# Patient Record
Sex: Female | Born: 1955 | Race: White | Hispanic: No | Marital: Married | Smoking: Never smoker
Health system: Southern US, Community
[De-identification: ages and names within clinical notes are randomized; demographics above are authoritative.]

## PROBLEM LIST (undated history)

## (undated) DIAGNOSIS — G4730 Sleep apnea, unspecified: Secondary | ICD-10-CM

## (undated) DIAGNOSIS — E785 Hyperlipidemia, unspecified: Secondary | ICD-10-CM

## (undated) DIAGNOSIS — S82892A Other fracture of left lower leg, initial encounter for closed fracture: Secondary | ICD-10-CM

## (undated) DIAGNOSIS — Z5189 Encounter for other specified aftercare: Secondary | ICD-10-CM

## (undated) DIAGNOSIS — I10 Essential (primary) hypertension: Secondary | ICD-10-CM

## (undated) DIAGNOSIS — B009 Herpesviral infection, unspecified: Secondary | ICD-10-CM

## (undated) DIAGNOSIS — R7303 Prediabetes: Secondary | ICD-10-CM

## (undated) DIAGNOSIS — Z86010 Personal history of colonic polyps: Secondary | ICD-10-CM

## (undated) DIAGNOSIS — M8580 Other specified disorders of bone density and structure, unspecified site: Secondary | ICD-10-CM

## (undated) DIAGNOSIS — M810 Age-related osteoporosis without current pathological fracture: Secondary | ICD-10-CM

## (undated) DIAGNOSIS — I240 Acute coronary thrombosis not resulting in myocardial infarction: Secondary | ICD-10-CM

## (undated) DIAGNOSIS — G2581 Restless legs syndrome: Secondary | ICD-10-CM

## (undated) HISTORY — DX: Essential (primary) hypertension: I10

## (undated) HISTORY — DX: Hyperlipidemia, unspecified: E78.5

## (undated) HISTORY — DX: Sleep apnea, unspecified: G47.30

## (undated) HISTORY — DX: Encounter for other specified aftercare: Z51.89

## (undated) HISTORY — PX: OTHER SURGICAL HISTORY: SHX169

## (undated) HISTORY — DX: Acute coronary thrombosis not resulting in myocardial infarction: I24.0

## (undated) HISTORY — DX: Restless legs syndrome: G25.81

## (undated) HISTORY — DX: Age-related osteoporosis without current pathological fracture: M81.0

## (undated) HISTORY — DX: Prediabetes: R73.03

## (undated) HISTORY — DX: Personal history of colonic polyps: Z86.010

## (undated) HISTORY — DX: Herpesviral infection, unspecified: B00.9

## (undated) HISTORY — DX: Other specified disorders of bone density and structure, unspecified site: M85.80

## (undated) HISTORY — DX: Other fracture of left lower leg, initial encounter for closed fracture: S82.892A

---
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
## 2000-06-10 ENCOUNTER — Encounter: Payer: Self-pay | Admitting: Family Medicine

## 2000-06-10 ENCOUNTER — Encounter: Admission: RE | Admit: 2000-06-10 | Discharge: 2000-06-10 | Payer: Self-pay | Admitting: Family Medicine

## 2003-06-26 HISTORY — PX: OTHER SURGICAL HISTORY: SHX169

## 2003-07-22 ENCOUNTER — Encounter: Admission: RE | Admit: 2003-07-22 | Discharge: 2003-07-22 | Payer: Self-pay | Admitting: Family Medicine

## 2003-07-29 ENCOUNTER — Encounter: Admission: RE | Admit: 2003-07-29 | Discharge: 2003-07-29 | Payer: Self-pay | Admitting: Family Medicine

## 2003-11-08 ENCOUNTER — Ambulatory Visit (HOSPITAL_COMMUNITY): Admission: RE | Admit: 2003-11-08 | Discharge: 2003-11-09 | Payer: Self-pay | Admitting: Neurological Surgery

## 2007-07-18 ENCOUNTER — Other Ambulatory Visit: Admission: RE | Admit: 2007-07-18 | Discharge: 2007-07-18 | Payer: Self-pay | Admitting: Obstetrics & Gynecology

## 2007-12-04 ENCOUNTER — Other Ambulatory Visit: Admission: RE | Admit: 2007-12-04 | Discharge: 2007-12-04 | Payer: Self-pay | Admitting: Obstetrics & Gynecology

## 2008-01-24 DIAGNOSIS — Z860100 Personal history of colon polyps, unspecified: Secondary | ICD-10-CM

## 2008-01-24 HISTORY — DX: Personal history of colon polyps, unspecified: Z86.0100

## 2008-01-24 HISTORY — DX: Personal history of colonic polyps: Z86.010

## 2008-07-29 ENCOUNTER — Other Ambulatory Visit: Admission: RE | Admit: 2008-07-29 | Discharge: 2008-07-29 | Payer: Self-pay | Admitting: Obstetrics & Gynecology

## 2008-10-11 ENCOUNTER — Encounter (INDEPENDENT_AMBULATORY_CARE_PROVIDER_SITE_OTHER): Payer: Self-pay | Admitting: Family Medicine

## 2008-10-11 ENCOUNTER — Ambulatory Visit: Payer: Self-pay

## 2008-12-30 ENCOUNTER — Encounter: Admission: RE | Admit: 2008-12-30 | Discharge: 2008-12-30 | Payer: Self-pay | Admitting: Otolaryngology

## 2009-09-23 HISTORY — PX: BREAST SURGERY: SHX581

## 2009-10-12 LAB — HM DEXA SCAN

## 2010-06-01 ENCOUNTER — Encounter
Admission: RE | Admit: 2010-06-01 | Discharge: 2010-06-01 | Payer: Self-pay | Source: Home / Self Care | Admitting: Family Medicine

## 2010-07-21 ENCOUNTER — Encounter
Admission: RE | Admit: 2010-07-21 | Discharge: 2010-07-21 | Payer: Self-pay | Source: Home / Self Care | Attending: Family Medicine | Admitting: Family Medicine

## 2010-11-01 ENCOUNTER — Encounter: Payer: Self-pay | Admitting: Cardiovascular Disease

## 2010-11-08 ENCOUNTER — Encounter: Payer: Self-pay | Admitting: Cardiovascular Disease

## 2010-11-10 NOTE — Op Note (Signed)
NAME:  Tracy Cabrera, Tracy Cabrera                         ACCOUNT NO.:  [id number]   MEDICAL RECORD NO.:  [id number]                   PATIENT TYPE:  OIB   LOCATION:  2899                                 FACILITY:  [facility]   PHYSICIAN:  Stefani Dama, M.D.               DATE OF BIRTH:  Apr 11, 1956   DATE OF PROCEDURE:  11/08/2003  DATE OF DISCHARGE:                                 OPERATIVE REPORT   PREOPERATIVE DIAGNOSES:  Herniated nucleus pulposus L5-S1, left, with left  lumbar radiculopathy.   POSTOPERATIVE DIAGNOSES:  Herniated nucleus pulposus L5-S1, left, with left  lumbar radiculopathy.   PROCEDURE:  L5-S1 left MET-RX micro-endoscopic diskectomy with the operating  microscope and micro-dissection technique.   SURGEON:  Stefani Dama, M.D.   ASSISTANT:  Hilda Lias, M.D.   ANESTHESIA:  General endotracheal.   INDICATIONS FOR PROCEDURE:  The patient is a 55 year old individual who has  had significant back and left lower extremity pain.  She has had evidence of  a herniated nucleus pulposus at L4-L5 on the left side.  She has been  advised regarding a diskectomy.   DESCRIPTION OF PROCEDURE:  The patient was brought to the operating room  supine on the stretcher.  After a smooth induction of general endotracheal  anesthesia, she was turned prone.  The back was shaved and prepped with  Hibiclens and alcohol, and then draped in a sterile fashion.  Using  fluoroscopic guidance, then the L5-S1 space was localized on the left side,  and making a small incision in the lateral aspect on the left side at L5-S1,  a K-wire was placed down to the laminar arch of L5, and using a wanding  technique a series of dilators were passed over the K-wire to dilate this to  the 18 mm diameter.  An 18 mm diameter by 7 cm deep endoscopic cannula was  then placed into the patient's back and affixed to the operating table with  a clamp.  The microscope was draped and brought into the field, and then  using monopolar cautery, the soft tissues over the laminar arch interspace  at L5-S1 was cleared.  A laminotomy, removing the inferior margin of the  lamina of L5 and the mesial wall of the facet was then performed using a  high-speed air drill and 2.3 mm dissecting tool.  The dura itself was  identified ultimately after opening the yellow ligament underneath the  laminar arch, and bony bleeding from the laminar arch was controlled with  some small pledgets of Gelfoam soaked in thrombin, which were later  irrigated away.  The dissection was then carried out to expose the common  dural tube.  Just cephalad to the disk space was noted to be a thick  __________ of scar, scarring the dural tube to the posterior longitudinal  ligament structures.  Dissection along the spleen was very difficult,  requiring substantial traction on  the common dural tube.  This was done,  however, in a very careful fashion, so as not to create any spinal fluid  leaks.  Ultimately the undersurface of the dura was partially dissected, and  the thickened ligament in this region was incised.  There was noted to be  some fragments of disk which were densely adhered to the posterior  longitudinal ligament, and the ligament itself was noted to be calcified, in  combination with being attached to the dura very densely.  Care was taken  during this dissection to extend the dissection cephalad to what was noted  to be the takeoff of the L5 nerve root.  The thick scar tissue was carefully  divided in a sharp fashion using bipolar cautery, and then some micro-  scissors along with a #11 blade on a bayonet scalpel holder, to allow for  dissection of this tissue and freeing it from the dura without obtaining any  cerebral spinal fluid leaks.  In the end, the fragment was mobilized and it  was then removed along with the ligament in a piecemeal fashion.  The  cephalad-most portion was pulled down and then ultimately cut with a  #11  blade.  This allowed for a substantial venous bleeding which was controlled  with some tamponade with Gelfoam.  The Gelfoam was again irrigated away.  The field was cleared of any mass effect from the Gelfoam itself.  The  ligament itself was then resected, along with the fragments of disk embedded  within it, all the way down to the area of the disk space.  The disk space  was entered and several small fragments of degenerated disk material were  removed from within it.  The opening of the disk space was noted to be quite  small, as the disk space had settled considerably.  Decompression of this  area with a resection of the ligamentous tissues around the area of the disk  space was performed, and hemostasis was ultimately obtained in this region.  The area was copiously irrigated with antibiotic irrigating solution, and  then 1 mL of fentanyl was left in place, with 40 mg of Depo-Medrol in the  epidural space.  The retractor was then removed.  The subcuticular tissue  was closed with #3-0 Vicryl in an interrupted fashion, and Dermabond was  used on the skin.  The patient tolerated the procedure well.  The blood loss was estimated at  approximately 150 mL.  The patient was returned to the recovery room in  stable condition.                                               Stefani Dama, M.D.    Merla Riches  D:  11/08/2003  T:  11/08/2003  Job:  [id number]

## 2010-11-22 LAB — HM COLONOSCOPY

## 2011-06-05 ENCOUNTER — Ambulatory Visit
Admission: RE | Admit: 2011-06-05 | Discharge: 2011-06-05 | Disposition: A | Discharge status: Home / Self Care | Payer: 59 | Source: Ambulatory Visit | Attending: Family Medicine | Admitting: Family Medicine

## 2011-06-05 ENCOUNTER — Other Ambulatory Visit: Payer: Self-pay | Admitting: Family Medicine

## 2011-07-12 ENCOUNTER — Institutional Professional Consult (permissible substitution): Payer: 59 | Admitting: Cardiovascular Disease

## 2011-07-19 ENCOUNTER — Encounter: Payer: Self-pay | Admitting: Cardiovascular Disease

## 2011-07-19 ENCOUNTER — Ambulatory Visit (INDEPENDENT_AMBULATORY_CARE_PROVIDER_SITE_OTHER): Payer: 59 | Admitting: Cardiovascular Disease

## 2011-07-19 DIAGNOSIS — R079 Chest pain, unspecified: Secondary | ICD-10-CM

## 2011-07-19 DIAGNOSIS — I209 Angina pectoris, unspecified: Secondary | ICD-10-CM | POA: Insufficient documentation

## 2011-07-19 DIAGNOSIS — R0602 Shortness of breath: Secondary | ICD-10-CM

## 2011-07-19 NOTE — Patient Instructions (Signed)
Your physician has requested that you have an exercise stress myoview. For further information please visit [email or web address] Please follow instruction sheet, as given.  Your physician has requested that you have an echocardiogram. Echocardiography is a painless test that uses sound waves to create images of your heart. It provides your doctor with information about the size and shape of your heart and how well your heart's chambers and valves are working. This procedure takes approximately one hour. There are no restrictions for this procedure.  Your physician wants you to follow-up in: 2 YEARS You will receive a reminder letter in the mail two months in advance. If you don't receive a letter, please call our office to schedule the follow-up appointment.

## 2011-07-19 NOTE — Assessment & Plan Note (Signed)
The patient has chest pain with both typical and atypical features. She has a strong risk profile predisposing her to coronary atherosclerosis with the presence of hypertension, dyslipidemia, and family history of coronary artery disease. I have recommended an exercise stress Myoview scan to rule out inducible ischemia. I reviewed the patient's lipid panel and her modifiable risk factors are under excellent control on her current medical therapy. Will determine followup based on the results of her stress test. If this is normal I would like to see her at least every other year in followup to maintain a relationship in this patient with significant ongoing risk of coronary disease.

## 2011-07-19 NOTE — Assessment & Plan Note (Signed)
I suspect this is related to asthma. However, considering the patient's family history of cardiomyopathy and minor abnormalities seen on her previous echo with calcification of the mitral annulus and dilatation of the left atrium, I recommended a repeat echocardiogram to rule out diastolic abnormalities or the development of LV dysfunction.

## 2011-07-19 NOTE — Progress Notes (Signed)
HPI:  This is a 56 year old woman presenting for initial evaluation of chest pain. She is followed by Dr. Duaine Dredge. The patient has developed occasional episodes of chest pain that occur with walking. She has difficulty characterizing the pain, stating that "it is not a pressure, but feels like someone is pushing on my chest." This occurs with walking at a brisk pace and improves with slowing down or stopping. She walks regularly at the [facility], up to 2-1/2 miles daily. The patient's chest pain does not occur every time she walks and it has been unpredictable. She also complains of a numbness in the left arm that occurs at rest and with exertion. She has chronic shortness of breath with exertion and relates this to asthma. This is unchanged over time and is relieved with inhalers. She denies edema, orthopnea, PND, lightheadedness, or syncope.  The patient has had a stress test but it has been approximately 15 years ago. She tells me this was normal. The patient had an echocardiogram done in our office on October 11, 2008. This showed normal left ventricular size and systolic function with an ejection fraction estimated at 55-65%. There was trivial aortic regurgitation and mild calcification of the mitral valve annulus. The left atrium was mildly dilated.  In review of the patient's medical records, her hemoglobin A1c was 6.1, myeloperoxidase was in the normal range of 343, LDL was 72, LP(a) is 3.0 (normal), total cholesterol is 135, triglycerides 104, HDL 43, April lipoprotein B1 100 is 66.  The patient has a strong family history of cardiac disease. She has a sister who died at age 79 of complications related to cardiomyopathy. Her father had 4 vessel CABG at age 76 and died of a myocardial infarction at age 92.  Outpatient Encounter Prescriptions as of 07/19/2011  Medication Sig Dispense Refill  . albuterol (PROVENTIL HFA;VENTOLIN HFA) 108 (90 BASE) MCG/ACT inhaler Inhale 2 puffs into the lungs every 6 (six)  hours as needed.      . calcium-vitamin D (OSCAL WITH D) 500-200 MG-UNIT per tablet Take 1 tablet by mouth daily.      . chlorthalidone (HYGROTON) 25 MG tablet Take 25 mg by mouth daily.       . clobetasol cream (TEMOVATE) 0.05 % Apply 1 application topically 2 (two) times daily.      . diazepam (VALIUM) 5 MG tablet Take 5 mg by mouth every 6 (six) hours as needed.       . fexofenadine (ALLEGRA) 180 MG tablet Take 180 mg by mouth as needed.      . fish oil-omega-3 fatty acids 1000 MG capsule Take 1,400 mg by mouth daily.      Marland Kitchen ibuprofen (ADVIL,MOTRIN) 100 MG tablet Take 100 mg by mouth every 6 (six) hours as needed.      Marland Kitchen lisinopril (PRINIVIL,ZESTRIL) 10 MG tablet Take 10 mg by mouth daily.      . naproxen sodium (ANAPROX) 220 MG tablet Take 220 mg by mouth as needed.      Marland Kitchen NITROSTAT 0.4 MG SL tablet Place 0.4 mg under the tongue every 5 (five) minutes as needed.       . NON FORMULARY 400 mg daily as needed.      Marland Kitchen QVAR 80 MCG/ACT inhaler Inhale 1 puff into the lungs 2 (two) times daily.       . Red Yeast Rice 600 MG CAPS Take 1,200 mg by mouth.      . simvastatin (ZOCOR) 40 MG tablet Take 40  mg by mouth every evening.        Vicodin  Past Medical History  Diagnosis Date  . Hyperlipidemia   . Asthma   . Hypertension   . Prediabetes     Past Surgical History  Procedure Date  . Left eye surgery for a retinal tear   . L5/s1 discectomy     History   Social History  . Marital Status: Married    Spouse Name: N/A    Number of Children: N/A  . Years of Education: N/A   Occupational History  . Not on file.   Social History Main Topics  . Smoking status: Never Smoker   . Smokeless tobacco: Not on file   Comment: lives with a heavy smoker  . Alcohol Use: Not on file  . Drug Use: Not on file  . Sexually Active: Not on file   Other Topics Concern  . Not on file   Social History Narrative   The patient is married. 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  She does not  smoke cigarettes. She drinks alcohol on rare occasion.   ROS: General: no fevers/chills/night sweats Eyes: no blurry vision, diplopia, or amaurosis ENT: no sore throat or hearing loss Resp: no cough, wheezing, or hemoptysis CV: no edema or palpitations GI: no abdominal pain, nausea, vomiting, diarrhea, or constipation GU: no dysuria, frequency, or hematuria Skin: no rash Neuro: positive for rare headache, otherwise negative for numbness, tingling, or weakness of extremities Musculoskeletal: positive for back pain, no joint pain or swelling Heme: no bleeding, DVT, positive for easy bruising Endo: no polydipsia or polyuria  BP 112/76  Pulse 67  Ht 5\' 5"  (1.651 m)  Wt 103.783 kg (228 lb 12.8 oz)  BMI 38.07 kg/m2  PHYSICAL EXAM: Pt is alert and oriented, very pleasant overweight woman, in no distress. HEENT: normal Neck: JVP normal. Carotid upstrokes normal without bruits. No thyromegaly. Lungs: equal expansion, clear bilaterally CV: Apex is discrete and nondisplaced, RRR without murmur or gallop Abd: soft, NT, +BS, no bruit, no hepatosplenomegaly Back: no CVA tenderness Ext: no C/C/E        Femoral pulses 2+= without bruits        DP/PT pulses intact and = Skin: warm and dry without rash Neuro: CNII-XII intact             Strength intact = bilaterally  EKG:  Normal sinus rhythm 67 beats per minute, within normal limits.  ASSESSMENT AND PLAN:

## 2011-07-24 ENCOUNTER — Ambulatory Visit (HOSPITAL_COMMUNITY): Payer: 59 | Attending: Cardiology | Admitting: Radiology

## 2011-07-24 DIAGNOSIS — R079 Chest pain, unspecified: Secondary | ICD-10-CM | POA: Insufficient documentation

## 2011-07-24 DIAGNOSIS — R7309 Other abnormal glucose: Secondary | ICD-10-CM | POA: Insufficient documentation

## 2011-07-24 DIAGNOSIS — R0602 Shortness of breath: Secondary | ICD-10-CM

## 2011-07-24 DIAGNOSIS — R072 Precordial pain: Secondary | ICD-10-CM

## 2011-07-24 DIAGNOSIS — J45909 Unspecified asthma, uncomplicated: Secondary | ICD-10-CM | POA: Insufficient documentation

## 2011-07-24 DIAGNOSIS — E785 Hyperlipidemia, unspecified: Secondary | ICD-10-CM | POA: Insufficient documentation

## 2011-07-31 ENCOUNTER — Ambulatory Visit (HOSPITAL_COMMUNITY): Payer: 59 | Attending: Cardiology | Admitting: Radiology

## 2011-07-31 DIAGNOSIS — R0602 Shortness of breath: Secondary | ICD-10-CM

## 2011-07-31 DIAGNOSIS — I10 Essential (primary) hypertension: Secondary | ICD-10-CM | POA: Insufficient documentation

## 2011-07-31 DIAGNOSIS — R079 Chest pain, unspecified: Secondary | ICD-10-CM

## 2011-07-31 DIAGNOSIS — E785 Hyperlipidemia, unspecified: Secondary | ICD-10-CM | POA: Insufficient documentation

## 2011-07-31 DIAGNOSIS — R209 Unspecified disturbances of skin sensation: Secondary | ICD-10-CM | POA: Insufficient documentation

## 2011-07-31 DIAGNOSIS — Z8249 Family history of ischemic heart disease and other diseases of the circulatory system: Secondary | ICD-10-CM | POA: Insufficient documentation

## 2011-07-31 MED ORDER — TECHNETIUM TC 99M TETROFOSMIN IV KIT
30.0000 | PACK | Freq: Once | INTRAVENOUS | Status: AC | PRN
  Administered 2011-07-31: 30 via INTRAVENOUS

## 2011-07-31 MED ORDER — TECHNETIUM TC 99M TETROFOSMIN IV KIT
10.0000 | PACK | Freq: Once | INTRAVENOUS | Status: AC | PRN
  Administered 2011-07-31: 10 via INTRAVENOUS

## 2011-07-31 NOTE — Progress Notes (Signed)
[name] SITE 3 NUCLEAR MED [address] [place] [address] [phone]  Cardiology Nuclear Med Study  Tracy Cabrera is a 56 y.o. female [id number] January 12, 1956   Nuclear Med Background Indication for Stress Test:  Evaluation for Ischemia History: '10 Echo:EF=55-65%,normal trivial AR and approx 15 yrs ago ION:GEXBMW per patient and approx 16 yrs ago MPS: normal per patient Cardiac Risk Factors: Family History - CAD, Hypertension and Lipids  Symptoms:  Chest Pain with and without exertion,numbness in Left arm and UXL:[id number]   Nuclear Pre-Procedure Caffeine/Decaff Intake:  9:00pm NPO After: 7:00pm   Lungs:  clear IV 0.9% NS with Angio Cath:  20g  IV Site: R Hand  IV Started by:  [name], RN  Chest Size (in):  42 Cup Size: D  Height: 5\' 5"  (1.651 m)  Weight:  226 lb (102.513 kg)  BMI:  Body mass index is 37.61 kg/(m^2). Tech Comments:  n/a    Nuclear Med Study 1 or 2 day study: 1 day  Stress Test Type:  Stress  Reading MD: [name], MD  Order Authorizing Provider:  Casimiro Needle Cooper,MD  Resting Radionuclide: Technetium 80m Tetrofosmin  Resting Radionuclide Dose: 11.0 mCi   Stress Radionuclide:  Technetium 60m Tetrofosmin  Stress Radionuclide Dose: 33.0 mCi           Stress Protocol Rest HR: 63 Stress HR: 157  Rest BP: 118/82 Stress BP: 155/71  Exercise Time (min): 9:00 METS: 10.1   Predicted Max HR: 163 bpm % Max HR: 96.32 bpm Rate Pressure Product: 72536   Dose of Adenosine (mg):  n/a Dose of Lexiscan: 0.4 mg  Dose of Atropine (mg): n/a Dose of Dobutamine: n/a mcg/kg/min (at max HR)  Stress Test Technologist: [name], RN  Nuclear Technologist:  Domenic Polite, CNMT     Rest Procedure:  Myocardial perfusion imaging was performed at rest 45 minutes following the intravenous administration of Technetium 54m Tetrofosmin. Rest ECG: NSR  Stress Procedure:  The patient exercised for 9:00.  The patient stopped due to  fatigue and target HR achieved. Patient had some SOB and denied any chest pain.  There were nonspecific  ST-T wave changes and no ectopy. Patient had expiratory wheezing in recovery. Patient used her Albuterol inhaler. Technetium 58m Tetrofosmin was injected at peak exercise and myocardial perfusion imaging was performed after a brief delay. Stress ECG: No significant change from baseline ECG  QPS Raw Data Images:  Patient motion noted; appropriate software correction applied. Stress Images:  Normal homogeneous uptake in all areas of the myocardium. Rest Images:  Normal homogeneous uptake in all areas of the myocardium. Subtraction (SDS):  No evidence of ischemia. Transient Ischemic Dilatation (Normal <1.22):  0.84 Lung/Heart Ratio (Normal <0.45):  0.28  Quantitative Gated Spect Images QGS EDV:  81 ml QGS ESV:  20 ml QGS cine images:  Normal Wall Motion QGS EF: 76%  Impression Exercise Capacity:  Good exercise capacity. BP Response:  Normal blood pressure response. Clinical Symptoms:  No chest pain. ECG Impression:  No significant ST segment change suggestive of ischemia. Comparison with Prior Nuclear Study: No images to compare  Overall Impression:  Normal stress nuclear study.  [name], MD

## 2011-08-02 ENCOUNTER — Telehealth: Payer: Self-pay | Admitting: Cardiovascular Disease

## 2011-08-02 NOTE — Telephone Encounter (Signed)
Fu call Patient returning your call from yesterday pls call her at work

## 2011-08-02 NOTE — Telephone Encounter (Signed)
Left message for pt to call back  °

## 2011-08-02 NOTE — Telephone Encounter (Signed)
Fu call °Patient returning your call again °

## 2011-08-02 NOTE — Telephone Encounter (Signed)
Pt aware of myoview and echo results by phone.  I have scheduled the pt to see Dr Excell Seltzer on 08/21/11.

## 2011-08-21 ENCOUNTER — Encounter: Payer: Self-pay | Admitting: Cardiovascular Disease

## 2011-08-21 ENCOUNTER — Ambulatory Visit (INDEPENDENT_AMBULATORY_CARE_PROVIDER_SITE_OTHER): Payer: 59 | Admitting: Cardiovascular Disease

## 2011-08-21 DIAGNOSIS — R079 Chest pain, unspecified: Secondary | ICD-10-CM

## 2011-08-21 DIAGNOSIS — I519 Heart disease, unspecified: Secondary | ICD-10-CM

## 2011-08-21 DIAGNOSIS — I5189 Other ill-defined heart diseases: Secondary | ICD-10-CM | POA: Insufficient documentation

## 2011-08-21 NOTE — Patient Instructions (Signed)
Your physician recommends that you continue on your current medications as directed. Please refer to the Current Medication list given to you today.   Your physician wants you to follow-up in:1 YEAR  You will receive a reminder letter in the mail two months in advance. If you don't receive a letter, please call our office to schedule the follow-up appointment.  

## 2011-08-21 NOTE — Progress Notes (Signed)
   HPI:  This is a 56 year old woman presenting for followup evaluation. The patient was seen January 24 for evaluation of chest pain. She underwent a nuclear stress test and an echocardiogram for evaluation.  The patient's exercise Myoview stress test result was reviewed. She exercised for 9 minutes according to the Bruce protocol. Expiratory wheezing was noted during the recovery phase and she used her albuterol inhaler. There was normal perfusion throughout the myocardium.  The patient's gated left ventricular ejection fraction was 76%.  An echocardiogram was also performed. This was done because of the patient's family history of cardiomyopathy. This demonstrated normal left ventricular size and systolic function with an ejection fraction of 55-60%. Features were consistent with pseudo-normal left ventricular filling suggestive of abnormal LV relaxation and increased filling pressure (grade 2 diastolic dysfunction).  The patient reports no change in her symptoms. She walks 2 miles at a 4 mile per hour pace. Sometimes she has chest pain and shortness of breath with exertion but other times she is able to walk without symptoms. She denies leg swelling, orthopnea, or PND.  Outpatient Encounter Prescriptions as of 08/21/2011  Medication Sig Dispense Refill  . albuterol (PROVENTIL HFA;VENTOLIN HFA) 108 (90 BASE) MCG/ACT inhaler Inhale 2 puffs into the lungs every 6 (six) hours as needed.      . calcium-vitamin D (OSCAL WITH D) 500-200 MG-UNIT per tablet Take 1 tablet by mouth daily.      . chlorthalidone (HYGROTON) 25 MG tablet Take 25 mg by mouth daily.       . clobetasol cream (TEMOVATE) 0.05 % Apply 1 application topically as needed.       . diazepam (VALIUM) 5 MG tablet Take 5 mg by mouth every 6 (six) hours as needed.       . fexofenadine (ALLEGRA) 180 MG tablet Take 180 mg by mouth as needed.      . fish oil-omega-3 fatty acids 1000 MG capsule Take 1,400 mg by mouth daily.      Marland Kitchen ibuprofen  (ADVIL,MOTRIN) 100 MG tablet Take 100 mg by mouth every 6 (six) hours as needed.      Marland Kitchen lisinopril (PRINIVIL,ZESTRIL) 10 MG tablet Take 10 mg by mouth daily.      . naproxen sodium (ANAPROX) 220 MG tablet Take 220 mg by mouth as needed.      Marland Kitchen NITROSTAT 0.4 MG SL tablet Place 0.4 mg under the tongue every 5 (five) minutes as needed.       . NON FORMULARY 400 mg daily as needed.      Marland Kitchen QVAR 80 MCG/ACT inhaler Inhale 1 puff into the lungs 2 (two) times daily.       . Red Yeast Rice 600 MG CAPS Take 1,200 mg by mouth.      . simvastatin (ZOCOR) 40 MG tablet Take 40 mg by mouth every evening.        Allergies  Allergen Reactions  . Vicodin (Hydrocodone-Acetaminophen)     Nausea     Past Medical History  Diagnosis Date  . Hyperlipidemia   . Asthma   . Hypertension   . Prediabetes     BP 134/86  Pulse 72  Ht 5\' 5"  (1.651 m)  Wt 103.329 kg (227 lb 12.8 oz)  BMI 37.91 kg/m2  PHYSICAL EXAM: Pt is alert and oriented, very pleasant woman in no acute distress. The physical examination was deferred today as our time was spent in discussion (total of 25 minutes).  ASSESSMENT AND PLAN:

## 2011-08-21 NOTE — Assessment & Plan Note (Signed)
Stress test results were reviewed with the patient. There is no evidence of myocardial ischemia. Elevated LV filling pressures could be causing angina, but I don't see any room to increase her medicines as she has excellent control of blood pressure on combination of ACE inhibitor and diuretic.

## 2011-08-21 NOTE — Assessment & Plan Note (Signed)
I had a long discussion with the patient and her husband today regarding the diagnosis of diastolic dysfunction. I would anticipate that this patient has a good prognosis. She has excellent control of her blood pressure. She is on appropriate medical therapy with a combination of chlorthalidone and lisinopril. She has a healthy blood pressure response with exertion as seen on her stress test. Her home blood pressures are in the range of 115/70. I don't think there is any room to improve on this and I would continue her current medications. I would like to see her back in one year for followup. She understands that weight loss would have a significant impact on her long-term symptoms and would help with exercise tolerance and reduction in shortness of breath.

## 2011-11-27 LAB — HM PAP SMEAR: HM Pap smear: NEGATIVE

## 2012-01-09 LAB — HM MAMMOGRAPHY

## 2012-12-31 ENCOUNTER — Ambulatory Visit: Payer: Self-pay | Admitting: Obstetrics and Gynecology

## 2012-12-31 ENCOUNTER — Telehealth: Payer: Self-pay | Admitting: Obstetrics and Gynecology

## 2012-12-31 DIAGNOSIS — Z0000 Encounter for general adult medical examination without abnormal findings: Secondary | ICD-10-CM

## 2012-12-31 NOTE — Telephone Encounter (Signed)
Patient was called about her appointment that was scheduled for today. She stated that she wasn't aware of this appointment that she a Dr. Hyacinth Meeker Patient . Do you want to charge a dnka fee? This was and ov she is not scheduled for her gyn until 02-24-2013

## 2012-12-31 NOTE — Telephone Encounter (Signed)
Do not charge for a [facility] appointment.

## 2013-02-24 ENCOUNTER — Encounter: Payer: Self-pay | Admitting: Obstetrics & Gynecology

## 2013-02-24 ENCOUNTER — Ambulatory Visit (INDEPENDENT_AMBULATORY_CARE_PROVIDER_SITE_OTHER): Payer: 59 | Admitting: Obstetrics & Gynecology

## 2013-02-24 VITALS — BP 116/78 | HR 64 | Resp 16 | Ht 65.0 in | Wt 232.6 lb

## 2013-02-24 DIAGNOSIS — Z01419 Encounter for gynecological examination (general) (routine) without abnormal findings: Secondary | ICD-10-CM

## 2013-02-24 NOTE — Patient Instructions (Addendum)

## 2013-02-24 NOTE — Progress Notes (Signed)
57 y.o. [id number] MarriedCaucasianF here for annual exam.  Has 2 children, ages 72 and 55 yo, that she and spouse are now guardians.  These are her grand-nephews.  Planning to adopt the children.  Oldest just went to kindergarten.    Doing well.  No vaginal bleeding.    Patient's last menstrual period was 06/25/2000.          Sexually active: yes  The current method of family planning is post menopausal status.    Exercising: yes  walking Smoker:  no  Health Maintenance: Pap:  11/27/11 WNL/negative HR HPV History of abnormal Pap:  yes MMG:  01/09/12 normal-scheduled for 02/25/13 Colonoscopy:  2012 repeat in 3 years, Dr. Matthias Hughs BMD:   10/12/09 mild osteopenia (-0.8/-1.3) TDaP:  2011 Screening Labs: PCP, Hb today: PCP, Urine today: PCP   reports that she has never smoked. She has never used smokeless tobacco. She reports that  drinks alcohol. She reports that she does not use illicit drugs.  Past Medical History  Diagnosis Date  . Hyperlipidemia   . Asthma   . Hypertension   . Prediabetes   . History of colon polyps 01/2008    Dr. Kennyth Arnold due 2015  . Blood transfusion without reported diagnosis 1973  . Restless leg syndrome     Past Surgical History  Procedure Laterality Date  . Left eye surgery for a retinal tear    . L5/s1 discectomy    . Breast surgery  09/2009    benign bx--fibrocystic changes    Current Outpatient Prescriptions  Medication Sig Dispense Refill  . albuterol (PROVENTIL HFA;VENTOLIN HFA) 108 (90 BASE) MCG/ACT inhaler Inhale 2 puffs into the lungs every 6 (six) hours as needed.      Marland Kitchen aspirin 81 MG tablet Take 81 mg by mouth daily.      . calcium-vitamin D (OSCAL WITH D) 500-200 MG-UNIT per tablet Take 1 tablet by mouth daily.      . chlorthalidone (HYGROTON) 25 MG tablet Take 25 mg by mouth daily.       . Cholecalciferol (VITAMIN D PO) Take by mouth. 1000 IU 2-3 daily      . clobetasol cream (TEMOVATE) 0.05 % Apply 1 application topically as needed.       .  diazepam (VALIUM) 5 MG tablet Take 5 mg by mouth every 6 (six) hours as needed.       . famciclovir (FAMVIR) 125 MG tablet Take 400 mg by mouth. 5 days for fever blister as needed      . fexofenadine (ALLEGRA) 180 MG tablet Take 180 mg by mouth as needed.      . fish oil-omega-3 fatty acids 1000 MG capsule Take 1,400 mg by mouth daily.      . fluticasone (FLONASE) 50 MCG/ACT nasal spray       . losartan (COZAAR) 50 MG tablet Take 50 mg by mouth daily.      . naproxen sodium (ANAPROX) 220 MG tablet Take 220 mg by mouth as needed.      Marland Kitchen omeprazole (PRILOSEC) 20 MG capsule Take 20 mg by mouth 2 (two) times daily.      Marland Kitchen QVAR 80 MCG/ACT inhaler Inhale 2 puffs into the lungs 2 (two) times daily.       . simvastatin (ZOCOR) 40 MG tablet Take 40 mg by mouth every evening.      Marland Kitchen ibuprofen (ADVIL,MOTRIN) 100 MG tablet Take 100 mg by mouth every 6 (six) hours as needed.      Marland Kitchen  NITROSTAT 0.4 MG SL tablet Place 0.4 mg under the tongue every 5 (five) minutes as needed.        No current facility-administered medications for this visit.    Family History  Problem Relation Age of Onset  . Coronary artery disease Father 73    CABG  . Hypertension Father   . Cardiomyopathy Sister 17    died of complications of cardiomyopathy  . Hypertension Sister   . Colon cancer Paternal Grandmother   . Diabetes Brother   . Hypertension Brother   . Hypertension Mother     ROS:  Pertinent items are noted in HPI.  Otherwise, a comprehensive ROS was negative.  Exam:   BP 116/78  Pulse 64  Resp 16  Ht 5\' 5"  (1.651 m)  Wt 232 lb 9.6 oz (105.507 kg)  BMI 38.71 kg/m2  LMP 06/25/2000  Weight change: +6lbs   Height: 5\' 5"  (165.1 cm)  Ht Readings from Last 3 Encounters:  02/24/13 5\' 5"  (1.651 m)  08/21/11 5\' 5"  (1.651 m)  07/31/11 5\' 5"  (1.651 m)    General appearance: alert, cooperative and appears stated age Head: Normocephalic, without obvious abnormality, atraumatic Neck: no adenopathy, supple,  symmetrical, trachea midline and thyroid normal to inspection and palpation Lungs: clear to auscultation bilaterally Breasts: normal appearance, no masses or tenderness Heart: regular rate and rhythm Abdomen: soft, non-tender; bowel sounds normal; no masses,  no organomegaly Extremities: extremities normal, atraumatic, no cyanosis or edema Skin: Skin color, texture, turgor normal. No rashes or lesions Lymph nodes: Cervical, supraclavicular, and axillary nodes normal. No abnormal inguinal nodes palpated Neurologic: Grossly normal   Pelvic: External genitalia:  no lesions              Urethra:  normal appearing urethra with no masses, tenderness or lesions              Bartholins and Skenes: normal                 Vagina: normal appearing vagina with normal color and discharge, no lesions              Cervix: no lesions              Pap taken: no Bimanual Exam:  Uterus:  normal size, contour, position, consistency, mobility, non-tender              Adnexa: normal adnexa and no mass, fullness, tenderness               Rectovaginal: Confirms               Anus:  normal sphincter tone, no lesions  A:  Well Woman with normal exam PMP, no HRT H/O colonic polyps H/O LS&A  P:   Mammogram yearly.  Appt scheduled. pap smear with neg HR HPV 6/13 Labs with PCP return annually or prn  An After Visit Summary was printed and given to the patient.

## 2013-06-06 ENCOUNTER — Other Ambulatory Visit: Payer: Self-pay | Admitting: Obstetrics & Gynecology

## 2013-06-08 NOTE — Telephone Encounter (Signed)
AEX was 02/24/13  Last Refilled: AEX 11/27/11 #60 grams with 2 refills was given No rx was given at AEX  Please advise.

## 2014-02-16 ENCOUNTER — Encounter: Payer: Self-pay | Admitting: Obstetrics & Gynecology

## 2014-03-12 ENCOUNTER — Ambulatory Visit: Payer: 59 | Admitting: Obstetrics & Gynecology

## 2014-04-26 ENCOUNTER — Encounter: Payer: Self-pay | Admitting: Obstetrics & Gynecology

## 2014-05-10 ENCOUNTER — Ambulatory Visit (INDEPENDENT_AMBULATORY_CARE_PROVIDER_SITE_OTHER): Payer: 59 | Admitting: Cardiovascular Disease

## 2014-05-10 ENCOUNTER — Encounter: Payer: Self-pay | Admitting: Cardiovascular Disease

## 2014-05-10 VITALS — BP 134/88 | HR 58 | Ht 65.0 in | Wt 248.8 lb

## 2014-05-10 DIAGNOSIS — R0789 Other chest pain: Secondary | ICD-10-CM

## 2014-05-10 NOTE — Patient Instructions (Signed)
Your physician wants you to follow-up in: 2 YEARS with Dr Burt Knack.  You will receive a reminder letter in the mail two months in advance. If you don't receive a letter, please call our office to schedule the follow-up appointment.  Your physician recommends that you continue on your current medications as directed. Please refer to the Current Medication list given to you today.

## 2014-05-10 NOTE — Progress Notes (Signed)
Background: the patient has been followed for chest pain. She underwent nuclear stress testing in 2013 demonstrating normal myocardial perfusion and normal LV function with an ejection fraction of 76%. An echocardiogram was performed because of her family history of cardiomyopathy in this confirmed normal LV systolic function with an EF of 55-60%. There was grade 2 diastolic dysfunction.  HPI:  58 year old woman presenting for follow-up evaluation. She has occasional resting chest discomfort and she attributes this to stress. She has no exertional symptoms. Describes the pain as a "pressure." She's been under a great deal of stress related to a complicated home situation. She's not been engaged in regular physical exercise. She denies dyspnea, edema, or heart palpitations. She's had no orthopnea or PND.   Outpatient Encounter Prescriptions as of 05/10/2014  Medication Sig  . albuterol (PROVENTIL HFA;VENTOLIN HFA) 108 (90 BASE) MCG/ACT inhaler Inhale 2 puffs into the lungs every 6 (six) hours as needed.  . ALPRAZolam (XANAX) 0.5 MG tablet Take 0.5 mg by mouth as needed.  Marland Kitchen aspirin 81 MG tablet Take 81 mg by mouth daily.  . calcium-vitamin D (OSCAL WITH D) 500-200 MG-UNIT per tablet Take 1 tablet by mouth daily.  . chlorthalidone (HYGROTON) 25 MG tablet Take 25 mg by mouth daily.   . Cholecalciferol (VITAMIN D PO) Take by mouth. 1000 IU 2-3 daily  . clobetasol ointment (TEMOVATE) 0.05 % APPLY TO AFFECTED AREA TWICE A DAY FOR UP TO 7 DAYS AS NEEDED  . diazepam (VALIUM) 5 MG tablet Take 5 mg by mouth every 6 (six) hours as needed.   . famciclovir (FAMVIR) 125 MG tablet Take 400 mg by mouth. 5 days for fever blister as needed  . fexofenadine (ALLEGRA) 180 MG tablet Take 180 mg by mouth as needed.  . fish oil-omega-3 fatty acids 1000 MG capsule Take 1,400 mg by mouth daily.  . fluticasone (FLONASE) 50 MCG/ACT nasal spray   . ibuprofen (ADVIL,MOTRIN) 100 MG tablet Take 100 mg by mouth every 6 (six)  hours as needed.  Marland Kitchen losartan (COZAAR) 50 MG tablet Take 50 mg by mouth daily.  . naproxen sodium (ANAPROX) 220 MG tablet Take 220 mg by mouth as needed.  Marland Kitchen NITROSTAT 0.4 MG SL tablet Place 0.4 mg under the tongue every 5 (five) minutes as needed.   Marland Kitchen omeprazole (PRILOSEC) 20 MG capsule Take 20 mg by mouth 2 (two) times daily.  Marland Kitchen QVAR 80 MCG/ACT inhaler Inhale 2 puffs into the lungs 2 (two) times daily.   . simvastatin (ZOCOR) 40 MG tablet Take 40 mg by mouth every evening.    Allergies  Allergen Reactions  . Vicodin [Hydrocodone-Acetaminophen]     Nausea     Past Medical History  Diagnosis Date  . Hyperlipidemia   . Asthma   . Hypertension   . Prediabetes   . History of colon polyps 01/2008    Dr. Idolina Primer due 2015  . Blood transfusion without reported diagnosis 1973  . Restless leg syndrome     family history includes Cardiomyopathy (age of onset: 64) in her sister; Colon cancer in her paternal grandmother; Coronary artery disease (age of onset: 46) in her father; Diabetes in her brother; Hypertension in her brother, father, mother, and sister.   ROS: Negative except as per HPI  BP 134/88 mmHg  Pulse 58  Ht 5\' 5"  (1.651 m)  Wt 248 lb 12.8 oz (112.855 kg)  BMI 41.40 kg/m2  LMP 06/25/2000  PHYSICAL EXAM: Pt is alert and oriented, pleasant obese  woman inNAD HEENT: normal Neck: JVP - normal, carotids 2+= without bruits Lungs: CTA bilaterally CV: RRR without murmur or gallop Abd: soft, NT, Positive BS, no hepatomegaly Ext: no C/C/E, distal pulses intact and equal Skin: warm/dry no rash  EKG:  Sinus bradycardia 58 beats per minute, within normal limits.  ASSESSMENT AND PLAN: 1. Hyperlipidemia. The patient is treated with simvastatin and fish oil. Labs reviewed with cholesterol 171, LDL 94, HDL 47, and triglycerides 176. LP (a) and Apo B1 are normal. Managed by Dr Sandi Mariscal. We reviewed the importance of weight loss, dietary modification, and exercise today.  2.  Chest pain. Fairly clear this is related to stress. I've reviewed her previous cardiac studies with no evidence for ischemia. I do not think further testing is indicated at this time.   3. Diastolic dysfunction. No evidence of volume overload on exam or based on history. Reviewed the link between obesity and development of diastolic CHF. She will work on lifestyle changes.  For follow-up, I will plan on seeing her back in 2 years since she follows regularly with Dr Sandi Mariscal.  Sherren Mocha, MD 05/10/2014 5:34 PM

## 2014-05-11 ENCOUNTER — Telehealth: Payer: Self-pay | Admitting: Obstetrics & Gynecology

## 2014-05-11 NOTE — Telephone Encounter (Signed)
lmtcb to reschedule AEX with Dr. Sabra Heck that was cancelled due to a surgery conflict 24/09/73.

## 2014-05-19 ENCOUNTER — Ambulatory Visit: Payer: 59 | Admitting: Obstetrics & Gynecology

## 2014-06-07 ENCOUNTER — Encounter: Payer: Self-pay | Admitting: Cardiovascular Disease

## 2014-06-14 ENCOUNTER — Ambulatory Visit: Payer: 59 | Admitting: Obstetrics & Gynecology

## 2014-06-22 ENCOUNTER — Ambulatory Visit (INDEPENDENT_AMBULATORY_CARE_PROVIDER_SITE_OTHER): Payer: 59 | Admitting: Obstetrics & Gynecology

## 2014-06-22 ENCOUNTER — Encounter: Payer: Self-pay | Admitting: Obstetrics & Gynecology

## 2014-06-22 VITALS — BP 120/70 | HR 68 | Resp 18 | Ht 64.75 in | Wt 244.0 lb

## 2014-06-22 DIAGNOSIS — Z124 Encounter for screening for malignant neoplasm of cervix: Secondary | ICD-10-CM

## 2014-06-22 DIAGNOSIS — Z01419 Encounter for gynecological examination (general) (routine) without abnormal findings: Secondary | ICD-10-CM

## 2014-06-22 MED ORDER — CLOBETASOL PROPIONATE 0.05 % EX OINT: TOPICAL_OINTMENT | CUTANEOUS | Status: DC

## 2014-06-22 NOTE — Progress Notes (Signed)
58 y.o. [id number] MarriedCaucasianF here for annual exam.  Doing well.  No vaginal bleeding.  Recent URI but better.  Christmas was nice.  Has two grandnephews that help keep pt really busy.  She has legal guardianship. Reports mild issues with urgency sensation.  Patient's last menstrual period was 06/25/2000.          Sexually active: Yes.    The current method of family planning is post menopausal status.    Exercising: No.  The patient does not participate in regular exercise at present. Smoker:  no  Health Maintenance: Pap:  11/2011 Neg. HR HPV:neg History of abnormal Pap:  no MMG:  02/2014 BIRADS2:Benign  Colonoscopy:  10/2013 Repeat 3 years  BMD:  09/2009 Osteopenia, -0.8/-1.3 TDaP:  PCP Screening Labs: PCP, Hb today: PCP, Urine today: PCP   reports that she has never smoked. She has never used smokeless tobacco. She reports that she drinks about 0.6 oz of alcohol per week. She reports that she does not use illicit drugs.  Past Medical History  Diagnosis Date  . Hyperlipidemia   . Asthma   . Hypertension   . Prediabetes   . History of colon polyps 01/2008    Dr. Idolina Primer due 2015  . Blood transfusion without reported diagnosis 1973  . Restless leg syndrome     Past Surgical History  Procedure Laterality Date  . Left eye surgery for a retinal tear    . L5/s1 discectomy  1973  . Breast surgery  09/2009    benign bx--fibrocystic changes    Current Outpatient Prescriptions  Medication Sig Dispense Refill  . albuterol (PROVENTIL HFA;VENTOLIN HFA) 108 (90 BASE) MCG/ACT inhaler Inhale 2 puffs into the lungs every 6 (six) hours as needed.    . ALPRAZolam (XANAX) 0.5 MG tablet Take 0.5 mg by mouth as needed.    Marland Kitchen aspirin 81 MG tablet Take 81 mg by mouth daily.    . calcium-vitamin D (OSCAL WITH D) 500-200 MG-UNIT per tablet Take 1 tablet by mouth daily.    . chlorthalidone (HYGROTON) 25 MG tablet Take 25 mg by mouth daily.     . Cholecalciferol (VITAMIN D PO) Take by mouth.  1000 IU 2-3 daily    . clobetasol ointment (TEMOVATE) 0.05 % APPLY TO AFFECTED AREA TWICE A DAY FOR UP TO 7 DAYS AS NEEDED 60 g 1  . diazepam (VALIUM) 5 MG tablet Take 5 mg by mouth every 6 (six) hours as needed.     . famciclovir (FAMVIR) 125 MG tablet Take 400 mg by mouth. 5 days for fever blister as needed    . fexofenadine (ALLEGRA) 180 MG tablet Take 180 mg by mouth as needed.    . fish oil-omega-3 fatty acids 1000 MG capsule Take 1,400 mg by mouth daily.    . fluticasone (FLONASE) 50 MCG/ACT nasal spray     . ibuprofen (ADVIL,MOTRIN) 100 MG tablet Take 100 mg by mouth every 6 (six) hours as needed.    Marland Kitchen losartan (COZAAR) 50 MG tablet Take 50 mg by mouth daily.    . naproxen sodium (ANAPROX) 220 MG tablet Take 220 mg by mouth as needed.    Marland Kitchen omeprazole (PRILOSEC) 20 MG capsule Take 20 mg by mouth 2 (two) times daily.    Marland Kitchen QVAR 80 MCG/ACT inhaler Inhale 2 puffs into the lungs 2 (two) times daily.     . Red Yeast Rice Extract (RED YEAST RICE PO) Take by mouth.    . simvastatin (ZOCOR)  40 MG tablet Take 40 mg by mouth every evening.    Marland Kitchen NITROSTAT 0.4 MG SL tablet Place 0.4 mg under the tongue every 5 (five) minutes as needed.      No current facility-administered medications for this visit.    Family History  Problem Relation Age of Onset  . Coronary artery disease Father 72    CABG  . Hypertension Father   . Cardiomyopathy Sister 11    died of complications of cardiomyopathy  . Hypertension Sister   . Colon cancer Paternal Grandmother   . Diabetes Brother   . Hypertension Brother   . Hypertension Mother     ROS:  Pertinent items are noted in HPI.  Otherwise, a comprehensive ROS was negative.  Exam:   BP 120/70 mmHg  Pulse 68  Resp 18  Ht 5' 4.75" (1.645 m)  Wt 244 lb (110.678 kg)  BMI 40.90 kg/m2  LMP 06/25/2000  Height: 5' 4.75" (164.5 cm)  Ht Readings from Last 3 Encounters:  06/22/14 5' 4.75" (1.645 m)  05/10/14 5\' 5"  (1.651 m)  02/24/13 5\' 5"  (1.651 m)     General appearance: alert, cooperative and appears stated age Head: Normocephalic, without obvious abnormality, atraumatic Neck: no adenopathy, supple, symmetrical, trachea midline and thyroid normal to inspection and palpation Lungs: clear to auscultation bilaterally Breasts: normal appearance, no masses or tenderness Heart: regular rate and rhythm Abdomen: soft, non-tender; bowel sounds normal; no masses,  no organomegaly Extremities: extremities normal, atraumatic, no cyanosis or edema Skin: Skin color, texture, turgor normal. No rashes or lesions Lymph nodes: Cervical, supraclavicular, and axillary nodes normal. No abnormal inguinal nodes palpated Neurologic: Grossly normal   Pelvic: External genitalia:  no lesions              Urethra:  normal appearing urethra with no masses, tenderness or lesions              Bartholins and Skenes: normal                 Vagina: normal appearing vagina with normal color and discharge, no lesions              Cervix: no lesions              Pap taken: Yes.   Bimanual Exam:  Uterus:  normal size, contour, position, consistency, mobility, non-tender              Adnexa: normal adnexa and no mass, fullness, tenderness               Rectovaginal: Confirms               Anus:  normal sphincter tone, no lesions  Chaperone was present for exam.  A:  Well Woman with normal exam PMP, no HRT H/O colonic polyps.  Last colonoscopy was 2015.  Due 5 years. H/O LS&A Mild OAB symptoms  P: Mammogram yearly.  pap smear with neg HR HPV 6/13.  Pap done. Labs with PCP Rx for Clobetasol 0.05% ointment up to BID x 7 days.  Pt aware of increased risks of vulvar cancer and aware to call if she has any issues with rough/raised or itchy areas that do not resolve. return annually or prn

## 2014-06-23 LAB — IPS PAP TEST WITH REFLEX TO HPV

## 2015-03-23 ENCOUNTER — Other Ambulatory Visit: Payer: Self-pay | Admitting: Family Medicine

## 2015-03-23 ENCOUNTER — Ambulatory Visit
Admission: RE | Admit: 2015-03-23 | Discharge: 2015-03-23 | Disposition: A | Discharge status: Home / Self Care | Payer: Commercial Managed Care - HMO | Source: Ambulatory Visit | Attending: Family Medicine | Admitting: Family Medicine

## 2015-03-23 DIAGNOSIS — S9031XA Contusion of right foot, initial encounter: Secondary | ICD-10-CM

## 2015-03-31 ENCOUNTER — Telehealth: Payer: Self-pay

## 2015-03-31 NOTE — Telephone Encounter (Signed)
Lmtcb//kn 

## 2015-03-31 NOTE — Telephone Encounter (Signed)
Patient notified of [facility] BMD results. Copy will be scanned in epic.//kn 

## 2015-05-26 DIAGNOSIS — S82892A Other fracture of left lower leg, initial encounter for closed fracture: Secondary | ICD-10-CM

## 2015-05-26 HISTORY — PX: ANKLE FRACTURE SURGERY: SHX122

## 2015-05-26 HISTORY — DX: Other fracture of left lower leg, initial encounter for closed fracture: S82.892A

## 2015-06-08 ENCOUNTER — Emergency Department (HOSPITAL_COMMUNITY): Payer: Commercial Managed Care - HMO

## 2015-06-08 ENCOUNTER — Emergency Department (HOSPITAL_COMMUNITY)
Admission: EM | Admit: 2015-06-08 | Discharge: 2015-06-08 | Disposition: A | Discharge status: Home / Self Care | Payer: Commercial Managed Care - HMO | Attending: Emergency Medicine | Admitting: Emergency Medicine

## 2015-06-08 ENCOUNTER — Encounter (HOSPITAL_COMMUNITY): Payer: Self-pay | Admitting: Emergency Medicine

## 2015-06-08 DIAGNOSIS — Z8669 Personal history of other diseases of the nervous system and sense organs: Secondary | ICD-10-CM | POA: Diagnosis not present

## 2015-06-08 DIAGNOSIS — Z7951 Long term (current) use of inhaled steroids: Secondary | ICD-10-CM | POA: Insufficient documentation

## 2015-06-08 DIAGNOSIS — Y9389 Activity, other specified: Secondary | ICD-10-CM | POA: Diagnosis not present

## 2015-06-08 DIAGNOSIS — S82832A Other fracture of upper and lower end of left fibula, initial encounter for closed fracture: Secondary | ICD-10-CM | POA: Diagnosis not present

## 2015-06-08 DIAGNOSIS — Z79899 Other long term (current) drug therapy: Secondary | ICD-10-CM | POA: Diagnosis not present

## 2015-06-08 DIAGNOSIS — Z7982 Long term (current) use of aspirin: Secondary | ICD-10-CM | POA: Insufficient documentation

## 2015-06-08 DIAGNOSIS — Y998 Other external cause status: Secondary | ICD-10-CM | POA: Insufficient documentation

## 2015-06-08 DIAGNOSIS — S82892A Other fracture of left lower leg, initial encounter for closed fracture: Secondary | ICD-10-CM

## 2015-06-08 DIAGNOSIS — S99912A Unspecified injury of left ankle, initial encounter: Secondary | ICD-10-CM | POA: Diagnosis present

## 2015-06-08 DIAGNOSIS — W1839XA Other fall on same level, initial encounter: Secondary | ICD-10-CM | POA: Insufficient documentation

## 2015-06-08 DIAGNOSIS — E785 Hyperlipidemia, unspecified: Secondary | ICD-10-CM | POA: Insufficient documentation

## 2015-06-08 DIAGNOSIS — J45909 Unspecified asthma, uncomplicated: Secondary | ICD-10-CM | POA: Insufficient documentation

## 2015-06-08 DIAGNOSIS — Z86010 Personal history of colonic polyps: Secondary | ICD-10-CM | POA: Insufficient documentation

## 2015-06-08 DIAGNOSIS — Y9289 Other specified places as the place of occurrence of the external cause: Secondary | ICD-10-CM | POA: Insufficient documentation

## 2015-06-08 DIAGNOSIS — I10 Essential (primary) hypertension: Secondary | ICD-10-CM | POA: Insufficient documentation

## 2015-06-08 MED ORDER — OXYCODONE-ACETAMINOPHEN 5-325 MG PO TABS
2.0000 | ORAL_TABLET | Freq: Once | ORAL | Status: AC
  Administered 2015-06-08: 2 via ORAL
  Filled 2015-06-08: qty 2

## 2015-06-08 MED ORDER — IBUPROFEN 600 MG PO TABS: 600.0000 mg | ORAL_TABLET | Freq: Four times a day (QID) | ORAL | Status: DC | PRN

## 2015-06-08 MED ORDER — OXYCODONE-ACETAMINOPHEN 5-325 MG PO TABS: 1.0000 | ORAL_TABLET | ORAL | Status: DC | PRN

## 2015-06-08 NOTE — Discharge Instructions (Signed)
Ankle Fracture A fracture is a break in a bone. The ankle joint is made up of three bones. These include the lower (distal)sections of your lower leg bones, called the tibia and fibula, along with a bone in your foot, called the talus. Depending on how bad the break is and if more than one ankle joint bone is broken, a cast or splint is used to protect and keep your injured bone from moving while it heals. Sometimes, surgery is required to help the fracture heal properly.  There are two general types of fractures:  Stable fracture. This includes a single fracture line through one bone, with no injury to ankle ligaments. A fracture of the talus that does not have any displacement (movement of the bone on either side of the fracture line) is also stable.  Unstable fracture. This includes more than one fracture line through one or more bones in the ankle joint. It also includes fractures that have displacement of the bone on either side of the fracture line. CAUSES  A direct blow to the ankle.   Quickly and severely twisting your ankle.  Trauma, such as a car accident or falling from a significant height. RISK FACTORS You may be at a higher risk of ankle fracture if:  You have certain medical conditions.  You are involved in high-impact sports.  You are involved in a high-impact car accident. SIGNS AND SYMPTOMS   Tender and swollen ankle.  Bruising around the injured ankle.  Pain on movement of the ankle.  Difficulty walking or putting weight on the ankle.  A cold foot below the site of the ankle injury. This can occur if the blood vessels passing through your injured ankle were also damaged.  Numbness in the foot below the site of the ankle injury. DIAGNOSIS  An ankle fracture is usually diagnosed with a physical exam and X-rays. A CT scan may also be required for complex fractures. TREATMENT  Stable fractures are treated with a cast or splint and using crutches to avoid putting  weight on your injured ankle. This is followed by an ankle strengthening program. Some patients require a special type of cast, depending on other medical problems they may have. Unstable fractures require surgery to ensure the bones heal properly. Your health care provider will tell you what type of fracture you have and the best treatment for your condition. HOME CARE INSTRUCTIONS   Review correct crutch use with your health care provider and use your crutches as directed. Safe use of crutches is extremely important. Misuse of crutches can cause you to fall or cause injury to nerves in your hands or armpits.  Do not put weight or pressure on the injured ankle until directed by your health care provider.  To lessen the swelling, keep the injured leg elevated while sitting or lying down.  Apply ice to the injured area:  Put ice in a plastic bag.  Place a towel between your cast and the bag.  Leave the ice on for 20 minutes, 2-3 times a day.  If you have a plaster or fiberglass cast:  Do not try to scratch the skin under the cast with any objects. This can increase your risk of skin infection.  Check the skin around the cast every day. You may put lotion on any red or sore areas.  Keep your cast dry and clean.  If you have a plaster splint:  Wear the splint as directed.  You may loosen the elastic   around the splint if your toes become numb, tingle, or turn cold or blue.  Do not put pressure on any part of your cast or splint; it may break. Rest your cast only on a pillow the first 24 hours until it is fully hardened.  Your cast or splint can be protected during bathing with a plastic bag sealed to your skin with medical tape. Do not lower the cast or splint into water.  Take medicines as directed by your health care provider. Only take over-the-counter or prescription medicines for pain, discomfort, or fever as directed by your health care provider.  Do not drive a vehicle until  your health care provider specifically tells you it is safe to do so.  If your health care provider has given you a follow-up appointment, it is very important to keep that appointment. Not keeping the appointment could result in a chronic or permanent injury, pain, and disability. If you have any problem keeping the appointment, call the facility for assistance. SEEK MEDICAL CARE IF: You develop increased swelling or discomfort. SEEK IMMEDIATE MEDICAL CARE IF:   Your cast gets damaged or breaks.  You have continued severe pain.  You develop new pain or swelling after the cast was put on.  Your skin or toenails below the injury turn blue or gray.  Your skin or toenails below the injury feel cold, numb, or have loss of sensitivity to touch.  There is a bad smell or pus draining from under the cast. MAKE SURE YOU:   Understand these instructions.  Will watch your condition.  Will get help right away if you are not doing well or get worse.   This information is not intended to replace advice given to you by your health care provider. Make sure you discuss any questions you have with your health care provider.   Document Released: 06/08/2000 Document Revised: 06/16/2013 Document Reviewed: 01/08/2013 Elsevier Interactive Patient Education 2016 [employer]  

## 2015-06-08 NOTE — ED Notes (Signed)
Bed: [id number] Expected date:  Expected time:  Means of arrival:  Comments: EMS ankle injury

## 2015-06-08 NOTE — ED Notes (Signed)
Per [facility] pt fell on stairs , twisted left ankle. Obvious edema and bruising, unable to flex ankle. Denies other injury. No obvious deformity. denies loc. alert and oriented x 4. No neck nor back injury.

## 2015-06-08 NOTE — ED Notes (Signed)
Discharge instructions and rx x2 reviewed with patient and family member. Patient and family member verbalized understanding.

## 2015-06-08 NOTE — ED Provider Notes (Signed)
CSN: [id number]     Arrival date & time 06/08/15  1957 History   First MD Initiated Contact with Patient 06/08/15 1959     Chief Complaint  Patient presents with  . Ankle Injury    left     (Consider location/radiation/quality/duration/timing/severity/associated sxs/prior Treatment) HPI Patient was going downstairs and twisted her ankle. She reports that it rolled inward. This caused her to fall. He denies any other injury during her fall. She reports that she was not able to put any weight on the ankle due to severe pain. She reports she cannot pull the foot upwards. Past Medical History  Diagnosis Date  . Hyperlipidemia   . Asthma   . Hypertension   . Prediabetes   . History of colon polyps 01/2008    Dr. Idolina Primer due 2015  . Blood transfusion without reported diagnosis 1973  . Restless leg syndrome    Past Surgical History  Procedure Laterality Date  . Left eye surgery for a retinal tear    . L5/s1 discectomy  1973  . Breast surgery  09/2009    benign bx--fibrocystic changes   Family History  Problem Relation Age of Onset  . Coronary artery disease Father 31    CABG  . Hypertension Father   . Cardiomyopathy Sister 21    died of complications of cardiomyopathy  . Hypertension Sister   . Colon cancer Paternal Grandmother   . Diabetes Brother   . Hypertension Brother   . Hypertension Mother    Social History  Substance Use Topics  . Smoking status: Never Smoker   . Smokeless tobacco: Never Used     Comment: lives with a heavy smoker  . Alcohol Use: 0.6 oz/week    1 Glasses of wine per week     Comment: 1-2 per month   OB History    Gravida Para Term Preterm AB TAB SAB Ectopic Multiple Living   2 2 2       2       Obstetric Comments   Legal Guardian for 2 nephews.     Review of Systems 10 Systems reviewed and are negative for acute change except as noted in the HPI.   Allergies  Vicodin  Home Medications   Prior to Admission medications    Medication Sig Start Date End Date Taking? Authorizing Provider  albuterol (PROVENTIL HFA;VENTOLIN HFA) 108 (90 BASE) MCG/ACT inhaler Inhale 2 puffs into the lungs every 6 (six) hours as needed.   Yes Historical Provider, MD  aspirin 81 MG tablet Take 81 mg by mouth daily.   Yes Historical Provider, MD  calcium-vitamin D (OSCAL WITH D) 500-200 MG-UNIT per tablet Take 1 tablet by mouth daily.   Yes Historical Provider, MD  chlorthalidone (HYGROTON) 25 MG tablet Take 25 mg by mouth daily.  07/18/11  Yes Historical Provider, MD  Cholecalciferol (VITAMIN D PO) Take by mouth. 1000 IU 2-3 daily   Yes Historical Provider, MD  clobetasol ointment (TEMOVATE) 0.05 % APPLY TO AFFECTED AREA TWICE A DAY FOR UP TO 7 DAYS AS NEEDED 06/22/14  Yes Megan Salon, MD  famciclovir [facility]) 250 MG tablet Take 250 mg by mouth daily as needed (fever blisters).  03/24/15  Yes Historical Provider, MD  fish oil-omega-3 fatty acids 1000 MG capsule Take 2,000 mg by mouth daily.    Yes Historical Provider, MD  ibuprofen (ADVIL,MOTRIN) 100 MG tablet Take 100 mg by mouth every 6 (six) hours as needed.   Yes Historical Provider,  MD  losartan (COZAAR) 50 MG tablet Take 50 mg by mouth daily.   Yes Historical Provider, MD  naproxen sodium (ANAPROX) 220 MG tablet Take 220 mg by mouth daily as needed (pain).    Yes Historical Provider, MD  QVAR 80 MCG/ACT inhaler Inhale 2 puffs into the lungs 2 (two) times daily.  05/22/11  Yes Historical Provider, MD  rosuvastatin (CRESTOR) 20 MG tablet Take 20 mg by mouth every evening.   Yes Historical Provider, MD  ALPRAZolam Duanne Moron) 0.5 MG tablet Take 0.5 mg by mouth 2 (two) times daily as needed for sleep.  03/19/14   Historical Provider, MD  diazepam (VALIUM) 5 MG tablet Take 5 mg by mouth every 6 (six) hours as needed for muscle spasms.  06/05/11   Historical Provider, MD  fexofenadine (ALLEGRA) 180 MG tablet Take 180 mg by mouth daily as needed for allergies.     Historical Provider, MD   fluticasone (FLONASE) 50 MCG/ACT nasal spray Place 2 sprays into both nostrils daily as needed for allergies.  02/21/13   Historical Provider, MD  ibuprofen (ADVIL,MOTRIN) 600 MG tablet Take 1 tablet (600 mg total) by mouth every 6 (six) hours as needed. 06/08/15   Charlesetta Shanks, MD  oxyCODONE-acetaminophen (PERCOCET/ROXICET) 5-325 MG tablet Take 1-2 tablets by mouth every 4 (four) hours as needed for severe pain. 06/08/15   Charlesetta Shanks, MD   SpO2 95%  LMP 06/25/2000 Physical Exam  Constitutional: She is oriented to person, place, and time. She appears well-developed and well-nourished.  HENT:  Head: Normocephalic and atraumatic.  Eyes: EOM are normal. Pupils are equal, round, and reactive to light.  Neck: Neck supple.  Cardiovascular: Normal rate, regular rhythm, normal heart sounds and intact distal pulses.   Pulmonary/Chest: Effort normal and breath sounds normal. She exhibits no tenderness.  Abdominal: Soft. Bowel sounds are normal. She exhibits no distension. There is no tenderness.  Musculoskeletal: She exhibits edema and tenderness.  Left ankle has moderate diffuse swelling. No obvious malalignment instead position. Yourself. Pulses 2+ and strong. Warm and dry. Other extremities have normal range of motion without pain.  Neurological: She is alert and oriented to person, place, and time. She has normal strength. Coordination normal. GCS eye subscore is 4. GCS verbal subscore is 5. GCS motor subscore is 6.  Skin: Skin is warm, dry and intact.  Psychiatric: She has a normal mood and affect.    ED Course  Procedures (including critical care time) Labs Review Labs Reviewed - No data to display  Imaging Review Dg Ankle Complete Left  06/08/2015  CLINICAL DATA:  Pain following fall EXAM: LEFT ANKLE COMPLETE - 3+ VIEW COMPARISON:  None. FINDINGS: Frontal, oblique, and lateral views were obtained. There is a fracture of the distal fibular diaphysis with fracture fragments in overall  near anatomic alignment. There is ankle mortise disruption ; the space between the medial malleolus and the talus is widened significantly. No distal tibial fracture seen. There is a spur arising from the inferior calcaneus. IMPRESSION: Fracture distal fibula with fracture fragments appearing in near anatomic alignment. Ankle mortise disruption with widening of the medial aspect of the mortise. Inferior calcaneal spur noted. Electronically Signed   By: Lowella Grip III M.D.   On: 06/08/2015 20:53   I have personally reviewed and evaluated these images and lab results as part of my medical decision-making.   EKG Interpretation None      MDM   Final diagnoses:  Closed left ankle fracture, initial encounter  Patient isolated ankle fracture. There is fibula fracture that is fairly well aligned with mortise widening and history consistent with ligamentous instability. Patient will be placed in a splint and made nonweightbearing. She has instructions to elevate and ice the extremity. She will follow-up with orthopedics for ongoing management.    Charlesetta Shanks, MD 06/08/15 2118

## 2015-08-16 ENCOUNTER — Ambulatory Visit (INDEPENDENT_AMBULATORY_CARE_PROVIDER_SITE_OTHER): Payer: Commercial Managed Care - HMO | Admitting: Obstetrics & Gynecology

## 2015-08-16 ENCOUNTER — Encounter: Payer: Self-pay | Admitting: Obstetrics & Gynecology

## 2015-08-16 VITALS — BP 116/66 | HR 84 | Resp 16 | Ht 64.75 in | Wt 254.0 lb

## 2015-08-16 DIAGNOSIS — Z01419 Encounter for gynecological examination (general) (routine) without abnormal findings: Secondary | ICD-10-CM

## 2015-08-16 MED ORDER — CLOBETASOL PROPIONATE 0.05 % EX OINT: TOPICAL_OINTMENT | CUTANEOUS | Status: DC

## 2015-08-16 NOTE — Progress Notes (Signed)
60 y.o. [id number] MarriedCaucasianF here for annual exam.  Pt broke ankle after falling down stairs.  Had surgery.  Was non-weight bearing for 8 weeks.  Now in a boot and is partial weight bearing for at least another month.    Pt reports vulvar itching under good control.  Maybe uses the clobetasol "once a month for no more than a day".  Denies vaginal bleeding.     PCP:  Dr. Sandi Mariscal. Lab work has been done.  Patient's last menstrual period was 06/25/2000.          Sexually active: Yes.    The current method of family planning is post menopausal status.    Exercising: No.  The patient does not participate in regular exercise at present. Smoker:  no  Health Maintenance: Pap:  06/22/14 Neg. 11/2011 Neg. HR HPV:Neg History of abnormal Pap:  no MMG:  03/15/15 BIRADS1:Neg Colonoscopy:  10/2013 Normal - repeat 3-5 years  BMD:  03/15/15 Osteopenia TDaP:  w/ PCP Screening Labs: PCP, Hb today: PCP, Urine today: PCP Zostavax:  2015 Hep C testing:  Pt is pretty sure Dr. Sandi Mariscal has tested this   reports that she has never smoked. She has never used smokeless tobacco. She reports that she drinks about 0.6 oz of alcohol per week. She reports that she does not use illicit drugs.  Past Medical History  Diagnosis Date  . Hyperlipidemia   . Asthma   . Hypertension   . Prediabetes   . History of colon polyps 01/2008    Dr. Idolina Primer due 2015  . Blood transfusion without reported diagnosis 1973  . Restless leg syndrome   . Fracture of ankle, left, closed 05/2015    Past Surgical History  Procedure Laterality Date  . Left eye surgery for a retinal tear    . L5/s1 discectomy  1973  . Breast surgery  09/2009    benign bx--fibrocystic changes  . Ankle fracture surgery Left 05/2015   Meds:  Reviewed in EPIC   Family History  Problem Relation Age of Onset  . Coronary artery disease Father 60    CABG  . Hypertension Father   . Cardiomyopathy Sister 68    died of complications of  cardiomyopathy  . Hypertension Sister   . Colon cancer Paternal Grandmother   . Diabetes Brother   . Hypertension Brother   . Hypertension Mother     ROS:  Pertinent items are noted in HPI.  Otherwise, a comprehensive ROS was negative.  Exam:   BP 116/66 mmHg  Pulse 84  Resp 16  Ht 5' 4.75" (1.645 m)  Wt 254 lb (115.214 kg)  BMI 42.58 kg/m2  LMP 06/25/2000  Weight change: +10#  Height: 5' 4.75" (164.5 cm)  Ht Readings from Last 3 Encounters:  08/16/15 5' 4.75" (1.645 m)  06/22/14 5' 4.75" (1.645 m)  05/10/14 5\' 5"  (1.651 m)    General appearance: alert, cooperative and appears stated age Head: Normocephalic, without obvious abnormality, atraumatic Neck: no adenopathy, supple, symmetrical, trachea midline and thyroid normal to inspection and palpation Lungs: clear to auscultation bilaterally Breasts: normal appearance, no masses or tenderness Heart: regular rate and rhythm Abdomen: soft, non-tender; bowel sounds normal; no masses,  no organomegaly Extremities: extremities normal, atraumatic, no cyanosis or edema Skin: Skin color, texture, turgor normal. No rashes or lesions Lymph nodes: Cervical, supraclavicular, and axillary nodes normal. No abnormal inguinal nodes palpated Neurologic: Grossly normal   Pelvic: External genitalia:  no lesions  Urethra:  normal appearing urethra with no masses, tenderness or lesions              Bartholins and Skenes: normal                 Vagina: normal appearing vagina with normal color and discharge, no lesions              Cervix: no lesions              Pap taken: No. Bimanual Exam:  Uterus:  normal size, contour, position, consistency, mobility, non-tender              Adnexa: normal adnexa and no mass, fullness, tenderness               Rectovaginal: Confirms               Anus:  normal sphincter tone, no lesions  Chaperone was present for exam.  A:  Well Woman with normal exam PMP, no HRT H/O colonic polyps.  Last colonoscopy was 2015. Due 3-5 years. H/O LS&A Mild OAB symptoms Right broken ankle 12/16, in a boot now  P: Mammogram yearly.  pap smear with neg HR HPV 6/13. Pap 2015 negative.  Plan pap with HR HPV next visit Labs and vaccines with Dr. Sandi Mariscal RF for clobetasol given for 0.05% ointment bid for no more than 7 days.  #60 gm/1RF. Return annually or prn

## 2016-02-08 ENCOUNTER — Telehealth: Payer: Self-pay | Admitting: Cardiovascular Disease

## 2016-02-08 NOTE — Telephone Encounter (Signed)
New message      Pt would like an earlier appt than scheduled on 11/20. Offered a PA pt declined. Please call.

## 2016-02-09 NOTE — Telephone Encounter (Signed)
I left the pt a detailed voicemail that I can place her on Dr Antionette Char wait list at this time.  I advised that if she is experiencing any symptoms then she should contact the office to arrange earlier follow-up with PA/NP.   In further review of appointments the pt was placed on Dr Jackalyn Lombard schedule incorrectly 11/20.  I have rescheduled the pt's appointment to Dr Burt Knack on 11/20 at 3:45 PM.

## 2016-02-13 NOTE — Telephone Encounter (Signed)
I spoke with the pt and arranged appointment on 02/17/16 with Dr Burt Knack.

## 2016-02-17 ENCOUNTER — Ambulatory Visit (INDEPENDENT_AMBULATORY_CARE_PROVIDER_SITE_OTHER): Payer: Commercial Managed Care - HMO | Admitting: Cardiovascular Disease

## 2016-02-17 ENCOUNTER — Encounter: Payer: Self-pay | Admitting: Cardiovascular Disease

## 2016-02-17 VITALS — BP 128/82 | HR 77 | Ht 65.0 in | Wt 255.2 lb

## 2016-02-17 DIAGNOSIS — R0789 Other chest pain: Secondary | ICD-10-CM | POA: Diagnosis not present

## 2016-02-17 DIAGNOSIS — R0602 Shortness of breath: Secondary | ICD-10-CM | POA: Diagnosis not present

## 2016-02-17 NOTE — Patient Instructions (Signed)
Medication Instructions:  Your physician recommends that you continue on your current medications as directed. Please refer to the Current Medication list given to you today.  Labwork: No new orders.   Testing/Procedures: Your physician has requested that you have an echocardiogram. Echocardiography is a painless test that uses sound waves to create images of your heart. It provides your doctor with information about the size and shape of your heart and how well your heart's chambers and valves are working. This procedure takes approximately one hour. There are no restrictions for this procedure.  Your physician has requested that you have a lexiscan myoview. For further information please visit [email or web address]. Please follow instruction sheet, as given.  Follow-Up: Your physician wants you to follow-up in: 1 YEAR with Dr Cooper.  You will receive a reminder letter in the mail two months in advance. If you don't receive a letter, please call our office to schedule the follow-up appointment.   Any Other Special Instructions Will Be Listed Below (If Applicable).     If you need a refill on your cardiac medications before your next appointment, please call your pharmacy.   

## 2016-02-17 NOTE — Progress Notes (Signed)
Cardiology Office Note Date:  02/17/2016   ID:  Tracy Cabrera, DOB 10/12/1955, MRN [id number]  PCP:  Marylene Land, MD  Cardiologist:  Sherren Mocha, MD    Chief Complaint  Patient presents with  . Follow-up    chest pressure     History of Present Illness: Tracy Cabrera is a 60 y.o. female who presents for follow-up evaluation. She has been seen for chest pain and family history of CAD/cardiomyopathy. Past workup included an echocardiogram and nuclear stress test in 2013, both of which were unremarkable.   She had episodes of chest pressure several weeks ago. In retrospect she feels like all of this was stress related. However, she admits to occasional 'indigestion' and there is an association with nausea. She's been more sedentary since sustaining an ankle fracture in December 2016. States she probably can't walk more than a few blocks now. Complains of exertional dyspnea. No orthopnea, PND, or heart palpitations. Stays busy, raising 2 young boys.   Past Medical History:  Diagnosis Date  . Asthma   . Blood transfusion without reported diagnosis 1973  . Fracture of ankle, left, closed 05/2015  . History of colon polyps 01/2008   Dr. Idolina Primer due 2015  . Hyperlipidemia   . Hypertension   . Prediabetes   . Restless leg syndrome     Past Surgical History:  Procedure Laterality Date  . ANKLE FRACTURE SURGERY Left 05/2015  . BREAST SURGERY  09/2009   benign bx--fibrocystic changes  . L5/S1 discectomy  1973  . Left eye surgery for a retinal tear      Current Outpatient Prescriptions  Medication Sig Dispense Refill  . albuterol (PROVENTIL HFA;VENTOLIN HFA) 108 (90 BASE) MCG/ACT inhaler Inhale 2 puffs into the lungs every 6 (six) hours as needed.    . ALPRAZolam (XANAX) 0.5 MG tablet Take 0.5 mg by mouth 2 (two) times daily as needed for sleep.     Marland Kitchen aspirin 81 MG tablet Take 81 mg by mouth daily.    . calcium-vitamin D (OSCAL WITH D) 500-200 MG-UNIT per tablet  Take 2 tablets by mouth daily.     . chlorthalidone (HYGROTON) 25 MG tablet Take 25 mg by mouth daily.     . Cholecalciferol (D3-1000 PO) Take 2 capsules by mouth daily.    . clobetasol ointment (TEMOVATE) 0.05 % Apple topically to affected area BID for no more than 7 days. 60 g 1  . diazepam (VALIUM) 5 MG tablet Take 5 mg by mouth every 6 (six) hours as needed for muscle spasms.     . famciclovir (FAMVIR) 250 MG tablet Take 250 mg by mouth daily as needed (fever blisters).     . fexofenadine (ALLEGRA) 180 MG tablet Take 180 mg by mouth daily as needed for allergies.     . fish oil-omega-3 fatty acids 1000 MG capsule Take 2,000 mg by mouth daily.     . fluticasone (FLONASE) 50 MCG/ACT nasal spray Place 2 sprays into both nostrils daily as needed for allergies.     Marland Kitchen ibuprofen (ADVIL,MOTRIN) 200 MG tablet Take 200 mg by mouth every 6 (six) hours as needed.    Marland Kitchen losartan (COZAAR) 50 MG tablet Take 50 mg by mouth daily.    . naproxen sodium (ANAPROX) 220 MG tablet Take 220 mg by mouth daily as needed (pain).     Marland Kitchen QVAR 80 MCG/ACT inhaler Inhale 2 puffs into the lungs 2 (two) times daily.     . rosuvastatin (  CRESTOR) 20 MG tablet Take 20 mg by mouth every evening.     No current facility-administered medications for this visit.     Allergies:   Vicodin [hydrocodone-acetaminophen]   Social History:  The patient  reports that she has never smoked. She has never used smokeless tobacco. She reports that she drinks about 0.6 oz of alcohol per week . She reports that she does not use drugs.   Family History:  The patient's  family history includes Cardiomyopathy (age of onset: 43) in her sister; Colon cancer in her paternal grandmother; Coronary artery disease (age of onset: 67) in her father; Diabetes in her brother; Hypertension in her brother, father, mother, and sister.    ROS:  Please see the history of present illness.  Otherwise, review of systems is positive for hearing loss, back pain,  shortness of breath with activity, snoring, ankle pain.  All other systems are reviewed and negative.    PHYSICAL EXAM: VS:  BP 128/82   Pulse 77   Ht 5\' 5"  (1.651 m)   Wt 115.8 kg (255 lb 3.2 oz)   LMP 06/25/2000   BMI 42.47 kg/m  , BMI Body mass index is 42.47 kg/m. GEN: Well nourished, well developed, in no acute distress  HEENT: normal  Neck: no JVD, no masses. No carotid bruits Cardiac: RRR without murmur or gallop                Respiratory:  clear to auscultation bilaterally, normal work of breathing GI: soft, nontender, nondistended, + BS MS: no deformity or atrophy  Ext: no pretibial edema, pedal pulses 2+= bilaterally Skin: warm and dry, no rash Neuro:  Strength and sensation are intact Psych: euthymic mood, full affect  EKG:  EKG is ordered today. The ekg ordered today shows NSR 77 bpm, within normal limits  Recent Labs: No results found for requested labs within last 8760 hours.   Lipid Panel  No results found for: CHOL, TRIG, HDL, CHOLHDL, VLDL, LDLCALC, LDLDIRECT    Wt Readings from Last 3 Encounters:  02/17/16 115.8 kg (255 lb 3.2 oz)  08/16/15 115.2 kg (254 lb)  06/22/14 110.7 kg (244 lb)     Cardiac Studies Reviewed: Echo 07-24-2011: Study Conclusions  - Left ventricle: The cavity size was normal. Wall thickness was normal. Systolic function was normal. The estimated ejection fraction was in the range of 55% to 60%. Wall motion was normal; there were no regional wall motion abnormalities. Features are consistent with a pseudonormal left ventricular filling pattern, with concomitant abnormal relaxation and increased filling pressure (grade 2 diastolic dysfunction). - Left atrium: The atrium was mildly dilated. - Right ventricle: The cavity size was mildly dilated.  Quantitative Gated Spect Images QGS EDV:  81 ml QGS ESV:  20 ml QGS cine images:  Normal Wall Motion QGS EF: 76%  Impression Exercise Capacity:  Good exercise  capacity. BP Response:  Normal blood pressure response. Clinical Symptoms:  No chest pain. ECG Impression:  No significant ST segment change suggestive of ischemia. Comparison with Prior Nuclear Study: No images to compare  Overall Impression:  Normal stress nuclear study.  ASSESSMENT AND PLAN: 1.  Obesity: importance of diet, exercise, lifestyle modification reviewed at length with the patient.   2. Chest pain/pressure: seems to be stress related. No exertional symptoms. Past stress test reviewed. Discussed a repeat nuclear scan but she declined. I think it's reasonable to continue with observation.   3. Diastolic dysfunction: seen on prior echo study.  Need to update echo. She understands the impact of obesity.   Current medicines are reviewed with the patient today.  The patient does not have concerns regarding medicines.  Labs/ tests ordered today include:   Orders Placed This Encounter  Procedures  . Myocardial Perfusion Imaging  . EKG 12-Lead  . ECHOCARDIOGRAM COMPLETE    Disposition:   FU one year  Signed, Sherren Mocha, MD  02/17/2016 10:29 PM    [address], [place], [place]  [address] Phone: [phone]; Fax: [phone]

## 2016-02-29 ENCOUNTER — Telehealth (HOSPITAL_COMMUNITY): Payer: Self-pay | Admitting: *Deleted

## 2016-02-29 NOTE — Telephone Encounter (Signed)
Left message on voicemail per DPR in reference to upcoming appointment scheduled on 03/05/16 with detailed instructions given per Myocardial Perfusion Study Information Sheet for the test. LM to arrive 15 minutes early, and that it is imperative to arrive on time for appointment to keep from having the test rescheduled. If you need to cancel or reschedule your appointment, please call the office within 24 hours of your appointment. Failure to do so may result in a cancellation of your appointment, and a $50 no show fee. Phone number given for call back for any questions. Kirstie Peri

## 2016-03-05 ENCOUNTER — Ambulatory Visit (HOSPITAL_BASED_OUTPATIENT_CLINIC_OR_DEPARTMENT_OTHER): Payer: Commercial Managed Care - HMO

## 2016-03-05 ENCOUNTER — Ambulatory Visit (HOSPITAL_COMMUNITY): Payer: Commercial Managed Care - HMO | Attending: Internal Medicine

## 2016-03-05 ENCOUNTER — Other Ambulatory Visit: Payer: Self-pay

## 2016-03-05 DIAGNOSIS — E669 Obesity, unspecified: Secondary | ICD-10-CM | POA: Diagnosis not present

## 2016-03-05 DIAGNOSIS — I351 Nonrheumatic aortic (valve) insufficiency: Secondary | ICD-10-CM | POA: Insufficient documentation

## 2016-03-05 DIAGNOSIS — I340 Nonrheumatic mitral (valve) insufficiency: Secondary | ICD-10-CM | POA: Diagnosis not present

## 2016-03-05 DIAGNOSIS — E785 Hyperlipidemia, unspecified: Secondary | ICD-10-CM | POA: Insufficient documentation

## 2016-03-05 DIAGNOSIS — I10 Essential (primary) hypertension: Secondary | ICD-10-CM | POA: Diagnosis not present

## 2016-03-05 DIAGNOSIS — R0600 Dyspnea, unspecified: Secondary | ICD-10-CM | POA: Diagnosis present

## 2016-03-05 DIAGNOSIS — R0602 Shortness of breath: Secondary | ICD-10-CM

## 2016-03-05 DIAGNOSIS — Z8249 Family history of ischemic heart disease and other diseases of the circulatory system: Secondary | ICD-10-CM | POA: Diagnosis not present

## 2016-03-05 DIAGNOSIS — R0789 Other chest pain: Secondary | ICD-10-CM

## 2016-03-05 DIAGNOSIS — Z6841 Body Mass Index (BMI) 40.0 and over, adult: Secondary | ICD-10-CM | POA: Insufficient documentation

## 2016-03-05 DIAGNOSIS — R0609 Other forms of dyspnea: Secondary | ICD-10-CM | POA: Insufficient documentation

## 2016-03-05 MED ORDER — TECHNETIUM TC 99M TETROFOSMIN IV KIT
33.0000 | PACK | Freq: Once | INTRAVENOUS | Status: AC | PRN
  Administered 2016-03-05: 33 via INTRAVENOUS
  Filled 2016-03-05: qty 33

## 2016-03-05 MED ORDER — REGADENOSON 0.4 MG/5ML IV SOLN
0.4000 mg | Freq: Once | INTRAVENOUS | Status: AC
  Administered 2016-03-05: 0.4 mg via INTRAVENOUS

## 2016-03-06 ENCOUNTER — Ambulatory Visit (HOSPITAL_COMMUNITY): Payer: Commercial Managed Care - HMO | Attending: Cardiology

## 2016-03-06 LAB — MYOCARDIAL PERFUSION IMAGING
LV dias vol: 103 mL (ref 46–106)
LV sys vol: 35 mL
Peak HR: 93 {beats}/min
RATE: 0.33
Rest HR: 62 {beats}/min
SDS: 3
SRS: 3
SSS: 6
TID: 0.87

## 2016-03-06 MED ORDER — TECHNETIUM TC 99M TETROFOSMIN IV KIT
32.8000 | PACK | Freq: Once | INTRAVENOUS | Status: AC | PRN
  Administered 2016-03-06: 32.8 via INTRAVENOUS
  Filled 2016-03-06: qty 33

## 2016-05-14 ENCOUNTER — Ambulatory Visit: Payer: Commercial Managed Care - HMO | Admitting: Cardiovascular Disease

## 2016-05-14 ENCOUNTER — Ambulatory Visit: Payer: Commercial Managed Care - HMO | Admitting: Internal Medicine

## 2016-06-28 ENCOUNTER — Encounter: Payer: Self-pay | Admitting: Obstetrics & Gynecology

## 2016-11-30 ENCOUNTER — Encounter: Payer: Self-pay | Admitting: Obstetrics & Gynecology

## 2016-11-30 ENCOUNTER — Other Ambulatory Visit (HOSPITAL_COMMUNITY)
Admission: RE | Admit: 2016-11-30 | Discharge: 2016-11-30 | Disposition: A | Discharge status: Home / Self Care | Payer: 59 | Source: Ambulatory Visit | Attending: Obstetrics & Gynecology | Admitting: Obstetrics & Gynecology

## 2016-11-30 ENCOUNTER — Ambulatory Visit (INDEPENDENT_AMBULATORY_CARE_PROVIDER_SITE_OTHER): Payer: Commercial Managed Care - HMO | Admitting: Obstetrics & Gynecology

## 2016-11-30 VITALS — BP 112/66 | HR 80 | Resp 16 | Ht 64.5 in | Wt 261.0 lb

## 2016-11-30 DIAGNOSIS — Z01419 Encounter for gynecological examination (general) (routine) without abnormal findings: Secondary | ICD-10-CM | POA: Diagnosis not present

## 2016-11-30 DIAGNOSIS — Z124 Encounter for screening for malignant neoplasm of cervix: Secondary | ICD-10-CM

## 2016-11-30 MED ORDER — CLOBETASOL PROPIONATE 0.05 % EX OINT: TOPICAL_OINTMENT | CUTANEOUS | 1 refills | Status: DC

## 2016-11-30 NOTE — Patient Instructions (Signed)
Please double check about the Hepatitis C testing

## 2016-11-30 NOTE — Progress Notes (Signed)
61 y.o. [id number] MarriedCaucasianF here for annual exam.  Doing well.  Denies vaginal bleeding.    PCP:  Dr. Sandi Mariscal.  Had yearly in the fall.  Crestor was increased from 20mg  to 40mg  with follow-up blood work in 3-4 months after the prior blood work.  Pt reports "everything else was good".  Patient's last menstrual period was 06/25/2000.          Sexually active: Yes.    The current method of family planning is post menopausal status.    Exercising: No.  walking Smoker:  no  Health Maintenance: Pap:  06/22/14 Neg   11/27/11 Neg. HR HPV:neg  History of abnormal Pap:  no MMG:  06/13/16 BIRADS1:neg  Colonoscopy:  10/2013 f/u 3-5 years.  Dr. Cristina Gong.  BMD:   03/15/15 Mild Osteopenia  TDaP:  Current with PCP Pneumonia vaccine(s):  Done with PCP Zostavax:   2015.  D/w pt new Shingrix vaccine. Hep C testing: Done with PCP Screening Labs: PCP   reports that she has never smoked. She has never used smokeless tobacco. She reports that she drinks about 0.6 oz of alcohol per week . She reports that she does not use drugs.  Past Medical History:  Diagnosis Date  . Asthma   . Blood transfusion without reported diagnosis 1973  . Fracture of ankle, left, closed 05/2015  . History of colon polyps 01/2008   Dr. Idolina Primer due 2015  . Hyperlipidemia   . Hypertension   . Prediabetes   . Restless leg syndrome     Past Surgical History:  Procedure Laterality Date  . ANKLE FRACTURE SURGERY Left 05/2015  . BREAST SURGERY  09/2009   benign bx--fibrocystic changes  . L5/S1 discectomy  1973  . Left eye surgery for a retinal tear      Current Outpatient Prescriptions  Medication Sig Dispense Refill  . albuterol (PROVENTIL HFA;VENTOLIN HFA) 108 (90 BASE) MCG/ACT inhaler Inhale 2 puffs into the lungs every 6 (six) hours as needed.    . ALPRAZolam (XANAX) 0.5 MG tablet Take 0.5 mg by mouth 2 (two) times daily as needed for sleep.     Marland Kitchen aspirin 81 MG tablet Take 81 mg by mouth daily.    .  calcium-vitamin D (OSCAL WITH D) 500-200 MG-UNIT per tablet Take 2 tablets by mouth daily.     . chlorthalidone (HYGROTON) 25 MG tablet Take 25 mg by mouth daily.     . Cholecalciferol (D3-1000 PO) Take 2 capsules by mouth daily.    . clobetasol ointment (TEMOVATE) 0.05 % Apple topically to affected area BID for no more than 7 days. 60 g 1  . diazepam (VALIUM) 5 MG tablet Take 5 mg by mouth every 6 (six) hours as needed for muscle spasms.     . famciclovir (FAMVIR) 250 MG tablet Take 250 mg by mouth daily as needed (fever blisters).     . fexofenadine (ALLEGRA) 180 MG tablet Take 180 mg by mouth daily as needed for allergies.     . fish oil-omega-3 fatty acids 1000 MG capsule Take 2,000 mg by mouth daily.     . fluticasone (FLONASE) 50 MCG/ACT nasal spray Place 2 sprays into both nostrils daily as needed for allergies.     Marland Kitchen ibuprofen (ADVIL,MOTRIN) 200 MG tablet Take 200 mg by mouth every 6 (six) hours as needed.    Marland Kitchen losartan (COZAAR) 50 MG tablet Take 50 mg by mouth daily.    . naproxen sodium (ANAPROX) 220 MG tablet Take  220 mg by mouth daily as needed (pain).     Marland Kitchen QVAR 80 MCG/ACT inhaler Inhale 2 puffs into the lungs 2 (two) times daily.     . rosuvastatin (CRESTOR) 40 MG tablet Take 1 tablet by mouth daily.     No current facility-administered medications for this visit.     Family History  Problem Relation Age of Onset  . Cardiomyopathy Sister 42       died of complications of cardiomyopathy  . Hypertension Sister   . Coronary artery disease Father 35       CABG  . Hypertension Father   . Colon cancer Paternal Grandmother   . Diabetes Brother   . Hypertension Brother   . Hypertension Mother     ROS:  Pertinent items are noted in HPI.  Otherwise, a comprehensive ROS was negative.  Exam:   BP 112/66 (BP Location: Right Arm, Patient Position: Sitting, Cuff Size: Normal)   Pulse 80   Resp 16   Ht 5' 4.5" (1.638 m)   Wt 261 lb (118.4 kg)   LMP 06/25/2000   BMI 44.11 kg/m    Weight change: +7#  Height: 5' 4.5" (163.8 cm)  Ht Readings from Last 3 Encounters:  11/30/16 5' 4.5" (1.638 m)  03/05/16 5\' 5"  (1.651 m)  02/17/16 5\' 5"  (1.651 m)    General appearance: alert, cooperative and appears stated age Head: Normocephalic, without obvious abnormality, atraumatic Neck: no adenopathy, supple, symmetrical, trachea midline and thyroid normal to inspection and palpation Lungs: clear to auscultation bilaterally Breasts: normal appearance, no masses or tenderness Heart: regular rate and rhythm Abdomen: soft, non-tender; bowel sounds normal; no masses,  no organomegaly Extremities: extremities normal, atraumatic, no cyanosis or edema Skin: Skin color, texture, turgor normal. No rashes or lesions Lymph nodes: Cervical, supraclavicular, and axillary nodes normal. No abnormal inguinal nodes palpated Neurologic: Grossly normal  Pelvic: External genitalia:  Hypopigmentation anteriorly between labia major and perirectally with some superficial skin breakdown              Urethra:  normal appearing urethra with no masses, tenderness or lesions              Bartholins and Skenes: normal                 Vagina: normal appearing vagina with normal color and discharge, no lesions              Cervix: no lesions              Pap taken: Yes.   Bimanual Exam:  Uterus:  normal size, contour, position, consistency, mobility, non-tender              Adnexa: normal adnexa and no mass, fullness, tenderness               Rectovaginal: Confirms               Anus:  normal sphincter tone, no lesions  Chaperone was present for exam.  A:  Well Woman with normal exam PMP, no HRT H/O colon polyps.  Sees Dr. Cristina Gong.   H/o lichen sclerosus--worse symptoms today H/o OAB symptoms  P:   Mammogram guidelines reviewed pap smear with HR HPV obtained today Labs and vaccines UTD with Dr. Sandi Mariscal Rx for Clobetasol 0.05% ointment bid for the next month.  Recheck 1 month.  return annually  or prn

## 2016-12-04 LAB — CYTOLOGY - PAP
Diagnosis: NEGATIVE
HPV: NOT DETECTED

## 2016-12-31 ENCOUNTER — Ambulatory Visit: Payer: Commercial Managed Care - HMO | Admitting: Obstetrics & Gynecology

## 2017-01-04 ENCOUNTER — Encounter: Payer: Self-pay | Admitting: Obstetrics & Gynecology

## 2017-01-04 ENCOUNTER — Ambulatory Visit (INDEPENDENT_AMBULATORY_CARE_PROVIDER_SITE_OTHER): Payer: 59 | Admitting: Obstetrics & Gynecology

## 2017-01-04 VITALS — BP 128/64 | HR 86 | Resp 16 | Wt 265.5 lb

## 2017-01-04 DIAGNOSIS — L900 Lichen sclerosus et atrophicus: Secondary | ICD-10-CM | POA: Diagnosis not present

## 2017-01-04 NOTE — Progress Notes (Signed)
GYNECOLOGY  VISIT    HPI: 61 y.o. G29P2002 Married Caucasian female here for follow up after being seen at [facility] with significant exacerbation of LS&A.  Pt was having burning with urination due to the significant skin changes and skin breakdown.  Reports she cannot believe how much better she feels.  Skin feels "back to normal".  Used the clobetasol bid for 3 days and then has been using nightly for about a month.  Denies vaginal bleeding.  Denies discharge.  No skin itching or burning with urination.  GYNECOLOGIC HISTORY: Patient's last menstrual period was 06/25/2000. Contraception: PMP Menopausal hormone therapy: no HRT  Patient Active Problem List   Diagnosis Date Noted  . Diastolic dysfunction 53/97/6734  . Chest pain 07/19/2011  . Shortness of breath 07/19/2011    Past Medical History:  Diagnosis Date  . Asthma   . Blood transfusion without reported diagnosis 1973  . Fracture of ankle, left, closed 05/2015  . History of colon polyps 01/2008   Dr. Idolina Primer due 2015  . Hyperlipidemia   . Hypertension   . Prediabetes   . Restless leg syndrome     Past Surgical History:  Procedure Laterality Date  . ANKLE FRACTURE SURGERY Left 05/2015  . BREAST SURGERY  09/2009   benign bx--fibrocystic changes  . L5/S1 discectomy  1973  . Left eye surgery for a retinal tear      MEDS:  Reviewed in EPIC and UTD  ALLERGIES: Vicodin [hydrocodone-acetaminophen]  Family History  Problem Relation Age of Onset  . Cardiomyopathy Sister 5       died of complications of cardiomyopathy  . Hypertension Sister   . Coronary artery disease Father 73       CABG  . Hypertension Father   . Colon cancer Paternal Grandmother   . Diabetes Brother   . Hypertension Brother   . Hypertension Mother     SH:  Married, non smoker  Review of Systems  All other systems reviewed and are negative.   PHYSICAL EXAMINATION:    BP 128/64 (BP Location: Right Arm, Patient Position: Sitting, Cuff Size:  Large)   Pulse 86   Resp 16   Wt 265 lb 8 oz (120.4 kg)   LMP 06/25/2000   BMI 44.87 kg/m     Physical Exam  Constitutional: She is oriented to person, place, and time. She appears well-developed and well-nourished.  Genitourinary:     Neurological: She is alert and oriented to person, place, and time.  Skin: Skin is warm and dry.  Psychiatric: She has a normal mood and affect.   Chaperone was present for exam.  Assessment: Lichen sclerosus  Plan: Pt will start to taper the clobetasol use and will continue nightly for the next couple month and then decreased to twice weekly for another 4-6 weeks.  Pt can then use for "flares" twice daily for up to 7 days.  Showed pt how to view skin and to see what "normal" looks like right now.  She is encouraged to check monthly and if sees any changes to call and be seen.  Hopefully this will prevent her from having the skin breakdown she had at the last visit.  Pt voices understanding.  ~15 minutes spent with patient >50% of time was in face to face discussion of above.

## 2017-01-04 NOTE — Patient Instructions (Signed)
Continue nightly steroid use for about three more weeks and then decreased to twice weekly use (at night) for about another 4-6 weeks.  Then just use as needed.

## 2017-04-13 IMAGING — NM NM MISC PROCEDURE
6 series · 36 of 36 positions shown · non-contrast
Comparison: none

[Series 1: stress · 6.51mm/px · 6 of 512 frames shown (1 of 2)]
[frame 43/512]
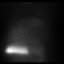
[frame 128/512]
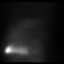
[frame 214/512]
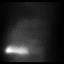
[frame 299/512]
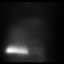
[frame 384/512]
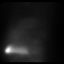
[frame 470/512]
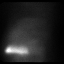

[Series 1: wbr_s-proj_st stress · 6.51mm/px · 6 of 512 frames shown (1 of 2)]
[frame 43/512]
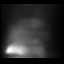
[frame 128/512]
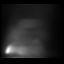
[frame 214/512]
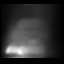
[frame 299/512]
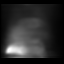
[frame 384/512]
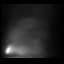
[frame 470/512]
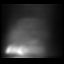

[Series 1: wbr_s-proj_st stress · 6.51mm/px · 6 of 64 frames shown (2 of 2)]
[frame 6/64]
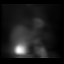
[frame 16/64]
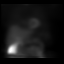
[frame 27/64]
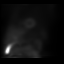
[frame 38/64]
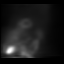
[frame 48/64]
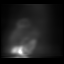
[frame 59/64]
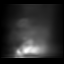

[Series 1: stress · 6.51mm/px · 6 of 64 frames shown (2 of 2)]
[frame 6/64]
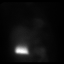
[frame 16/64]
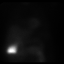
[frame 27/64]
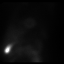
[frame 38/64]
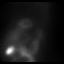
[frame 48/64]
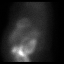
[frame 59/64]
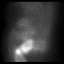

[Series 2: wbr_r-proj_st rest · 6.51mm/px · 6 of 64 frames shown]
[frame 6/64]
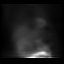
[frame 16/64]
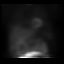
[frame 27/64]
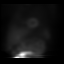
[frame 38/64]
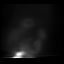
[frame 48/64]
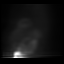
[frame 59/64]
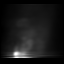

[Series 2: rest · 6.51mm/px · 6 of 64 frames shown]
[frame 6/64]
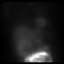
[frame 16/64]
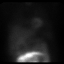
[frame 27/64]
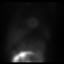
[frame 38/64]
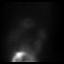
[frame 48/64]
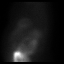
[frame 59/64]
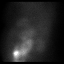

[36 of 36 positions shown; findings below may reference images not displayed]

Canned report from images found in remote index.

Refer to host system for actual result text.

## 2017-07-15 ENCOUNTER — Encounter: Payer: Self-pay | Admitting: Obstetrics & Gynecology

## 2017-11-25 ENCOUNTER — Other Ambulatory Visit (HOSPITAL_BASED_OUTPATIENT_CLINIC_OR_DEPARTMENT_OTHER): Payer: Self-pay

## 2017-11-25 DIAGNOSIS — R5383 Other fatigue: Secondary | ICD-10-CM

## 2017-11-25 DIAGNOSIS — G4710 Hypersomnia, unspecified: Secondary | ICD-10-CM

## 2017-11-25 DIAGNOSIS — G4730 Sleep apnea, unspecified: Secondary | ICD-10-CM

## 2017-11-25 DIAGNOSIS — R0683 Snoring: Secondary | ICD-10-CM

## 2017-12-17 ENCOUNTER — Ambulatory Visit (HOSPITAL_BASED_OUTPATIENT_CLINIC_OR_DEPARTMENT_OTHER): Payer: 59 | Attending: Family Medicine | Admitting: Internal Medicine

## 2017-12-17 VITALS — Ht 65.0 in | Wt 253.0 lb

## 2017-12-17 DIAGNOSIS — G4733 Obstructive sleep apnea (adult) (pediatric): Secondary | ICD-10-CM | POA: Insufficient documentation

## 2017-12-17 DIAGNOSIS — G4710 Hypersomnia, unspecified: Secondary | ICD-10-CM

## 2017-12-17 DIAGNOSIS — R0683 Snoring: Secondary | ICD-10-CM

## 2017-12-17 DIAGNOSIS — G4730 Sleep apnea, unspecified: Secondary | ICD-10-CM

## 2017-12-17 DIAGNOSIS — R5383 Other fatigue: Secondary | ICD-10-CM

## 2017-12-28 DIAGNOSIS — R0683 Snoring: Secondary | ICD-10-CM

## 2017-12-28 NOTE — Procedures (Signed)
    Patient Name: Tracy Cabrera, Tracy Cabrera Date: 12/17/2017 Gender: Female D.O.B: October 16, 1955 Age (years): 61 Referring Provider: Derinda Late Height (inches): 84 Interpreting Physician: Baird Lyons MD, ABSM Weight (lbs): 253 RPSGT: Carolin Coy BMI: 42 MRN: [id number] Neck Size: 14.00  CLINICAL INFORMATION Sleep Study Type: NPSG  Indication for sleep study: Fatigue, Hypertension, Obesity, Snoring  Epworth Sleepiness Score: 19  SLEEP STUDY TECHNIQUE As per the AASM Manual for the Scoring of Sleep and Associated Events v2.3 (April 2016) with a hypopnea requiring 4% desaturations.  The channels recorded and monitored were frontal, central and occipital EEG, electrooculogram (EOG), submentalis EMG (chin), nasal and oral airflow, thoracic and abdominal wall motion, anterior tibialis EMG, snore microphone, electrocardiogram, and pulse oximetry.  MEDICATIONS Medications self-administered by patient taken the night of the study : [place] The study was initiated at 9:43:56 PM and ended at 4:43:17 AM.  Sleep onset time was 40.5 minutes and the sleep efficiency was 59.0%%. The total sleep time was 247.5 minutes.  Stage REM latency was 266.5 minutes.  The patient spent 11.5%% of the night in stage N1 sleep, 71.9%% in stage N2 sleep, 0.2%% in stage N3 and 16.36% in REM.  Alpha intrusion was absent.  Supine sleep was 16.78%.  RESPIRATORY PARAMETERS The overall apnea/hypopnea index (AHI) was 13.1 per hour. There were 2 total apneas, including 2 obstructive, 0 central and 0 mixed apneas. There were 52 hypopneas and 52 RERAs.  The AHI during Stage REM sleep was 41.5 per hour.  AHI while supine was 24.6 per hour.  The mean oxygen saturation was 90.6%. The minimum SpO2 during sleep was 82.0%.  soft snoring was noted during this study.  CARDIAC DATA The 2 lead EKG demonstrated sinus rhythm. The mean heart rate was 65.8 beats per minute. Other EKG findings  include: PVCs.  LEG MOVEMENT DATA The total PLMS were 0 with a resulting PLMS index of 0.0. Associated arousal with leg movement index was 0.0 .  IMPRESSIONS - Mild obstructive sleep apnea occurred during this study (AHI = 13.1/h). - Insufficent early events to meet protocol requirements for split CPAP titration. - No significant central sleep apnea occurred during this study (CAI = 0.0/h). - Mild oxygen desaturation was noted during this study (Min O2 = 82.0%). - The patient snored with soft snoring volume. - EKG findings include PVCs. - Clinically significant periodic limb movements did not occur during sleep. No significant associated arousals.  DIAGNOSIS - Obstructive Sleep Apnea (327.23 [G47.33 ICD-10])  RECOMMENDATIONS - Suggest CPAP titration sleep study or DME autopap. Other options such as a fitted oral appliance would be based on clinical judgment. - Avoid alcohol, sedatives and other CNS depressants that may worsen sleep apnea and disrupt normal sleep architecture. - Sleep hygiene should be reviewed to assess factors that may improve sleep quality. - Weight management and regular exercise should be initiated or continued if appropriate.  [Electronically signed] 12/28/2017 11:31 AM  Baird Lyons MD, ABSM Diplomate, American Board of Sleep Medicine   NPI: [id number]                         [place] [place] [facility]  ELECTRONICALLY SIGNED ON:  12/28/2017, 11:28 AM [place] PH: [phone]   FX: [phone] [place]

## 2018-02-07 ENCOUNTER — Ambulatory Visit: Payer: 59 | Admitting: Pulmonary Disease

## 2018-02-07 ENCOUNTER — Encounter: Payer: Self-pay | Admitting: Pulmonary Disease

## 2018-02-07 VITALS — BP 124/76 | HR 69 | Ht 65.0 in | Wt 256.8 lb

## 2018-02-07 DIAGNOSIS — G4733 Obstructive sleep apnea (adult) (pediatric): Secondary | ICD-10-CM

## 2018-02-07 DIAGNOSIS — Z6841 Body Mass Index (BMI) 40.0 and over, adult: Secondary | ICD-10-CM

## 2018-02-07 NOTE — Patient Instructions (Signed)
Follow up in 6 months 

## 2018-02-07 NOTE — Progress Notes (Signed)
   Subjective:    Patient ID: Tracy Cabrera, female    DOB: 23-Jan-1956, 62 y.o.   MRN: [id number]  HPI    Review of Systems  Constitutional: Negative.   HENT: Negative.   Eyes: Negative.   Respiratory: Positive for shortness of breath.   Cardiovascular: Negative.   Gastrointestinal: Negative.   Endocrine: Negative.   Genitourinary: Negative.   Musculoskeletal: Negative.   Skin: Negative.   Allergic/Immunologic: Negative.   Neurological: Positive for headaches.  Hematological: Negative.   Psychiatric/Behavioral: Positive for sleep disturbance.       Objective:   Physical Exam        Assessment & Plan:

## 2018-02-07 NOTE — Progress Notes (Signed)
[facility]  Chief Complaint  Patient presents with  . sleep consult    referred by Dr. Sandi Mariscal for sleep apnea    Constitutional: BP 124/76 (BP Location: Left Arm, Cuff Size: Normal)   Pulse 69   Ht 5\' 5"  (1.651 m)   Wt 256 lb 12.8 oz (116.5 kg)   LMP 06/25/2000   SpO2 95%   BMI 42.73 kg/m   History of Present Illness: Tracy Cabrera is a 62 y.o. female with obstructive sleep apnea.  She works as a Engineer, maintenance (IT).  She was very busy with tax season in April.  She noticed having more trouble with her sleep and feeling sleepy during the day.  She was snoring more and woke a couple times feeling her throat was closing.  Her son has sleep apnea and he was worried she could have this also.  She had sleep study in June and showed mild sleep apnea.  Since then she has started exercising more and changed her diet.  She has lost 9 lbs and feels her sleep is better.  She isn't snoring as much.  She goes to sleep at 10 pm.  She falls asleep 30 minutes.  She wakes up 2 times to use the bathroom.  She gets out of bed at 630 am.  She feels rested in the morning.  She denies morning headache.  She does not use anything to help her stay awake.  She takes xanax during tax season to help shut her mind down to go to sleep, but doesn't use anything to help sleep otherwise.  She denies sleep walking, sleep talking, bruxism, or nightmares.  There is no history of restless legs.  She denies sleep hallucinations, sleep paralysis, or cataplexy.  The Epworth score is 13 out of 24.  Comprehensive Respiratory Exam:  Appearance - well kempt  ENMT - nasal mucosa moist, turbinates clear, midline nasal septum, no dental lesions, no gingival bleeding, no oral exudates, no tonsillar hypertrophy, MP 3, scalloped tongue Neck - no masses, trachea midline, no thyromegaly, no elevation in JVP Respiratory - normal appearance of chest wall, normal respiratory effort w/o accessory muscle  use, no dullness on percussion, no wheezing or rales CV - s1s2 regular rate and rhythm, no murmurs, no peripheral edema, no varicosities, radial pulses symmetric GI - soft, non tender, no masses, no hepatosplenomegaly Lymph - no adenopathy noted in neck and axillary areas MSK - normal muscle strength and tone, normal gait Ext - no cyanosis, clubbing, or joint inflammation noted Skin - no rashes, lesions, or ulcers Neuro - oriented to person, place, and time Psych - normal mood and affect  Discussion: She has recent sleep study that mild sleep apnea.  Since then she has changed her diet and started exercising more.  She feels her sleep has improved with weight loss.  Assessment/Plan:  Obstructive sleep apnea. - various therapies for treatment were reviewed: CPAP, oral appliance, and surgical interventions - she would like to continue working on her weight and then reassess whether she needs to have additional interventions for sleep apnea  Obesity. - discussed how weight can impact sleep and risk for sleep disordered breathing - discussed options to assist with weight loss: combination of diet modification, cardiovascular and strength training exercises  Cardiovascular risk. - had an extensive discussion regarding the adverse health consequences related to untreated sleep disordered breathing - specifically discussed the risks for hypertension, coronary artery disease, cardiac dysrhythmias, cerebrovascular disease, and diabetes -  lifestyle modification discussed  Safe driving practices. - discussed how sleep disruption can increase risk of accidents, particularly when driving - safe driving practices were discussed    Patient Instructions  Follow up in 6 months    Chesley Mires, MD [place] 02/07/2018, 2:39 PM  Flow Sheet  Sleep tests: PSG 12/17/17 >> AHI 13.1, SpO2 low 82%   Cardiac tests: Echo 03/05/16 >> EF 60 to 65%, grade 2 DD, mild AR and  MR  Review of Systems: Constitutional: Negative.   HENT: Negative.   Eyes: Negative.   Respiratory: Positive for shortness of breath.   Cardiovascular: Negative.   Gastrointestinal: Negative.   Endocrine: Negative.   Genitourinary: Negative.   Musculoskeletal: Negative.   Skin: Negative.   Allergic/Immunologic: Negative.   Neurological: Positive for headaches.  Hematological: Negative.   Psychiatric/Behavioral: Positive for sleep disturbance.   Past Medical History: She  has a past medical history of Asthma, Blood transfusion without reported diagnosis (1973), Fracture of ankle, left, closed (05/2015), History of colon polyps (01/2008), Hyperlipidemia, Hypertension, Prediabetes, and Restless leg syndrome.  Past Surgical History: She  has a past surgical history that includes Left eye surgery for a retinal tear; L5/S1 discectomy (1973); Breast surgery (09/2009); and Ankle fracture surgery (Left, 05/2015).  Family History: Her family history includes Cardiomyopathy (age of onset: 28) in her sister; Colon cancer in her paternal grandmother; Coronary artery disease (age of onset: 14) in her father; Diabetes in her brother; Hypertension in her brother, father, mother, and sister.  Social History: She  reports that she has never smoked. She has never used smokeless tobacco. She reports that she drinks about 1.0 standard drinks of alcohol per week. She reports that she does not use drugs.  Medications: Allergies as of 02/07/2018      Reactions   Vicodin [hydrocodone-acetaminophen]    Nausea      Medication List        Accurate as of 02/07/18  2:39 PM. Always use your most recent med list.          albuterol 108 (90 Base) MCG/ACT inhaler Commonly known as:  PROVENTIL HFA;VENTOLIN HFA Inhale 2 puffs into the lungs every 6 (six) hours as needed.   ALPRAZolam 0.5 MG tablet Commonly known as:  XANAX Take 0.5 mg by mouth 2 (two) times daily as needed for sleep.   aspirin 81 MG  tablet Take 81 mg by mouth daily.   calcium-vitamin D 500-200 MG-UNIT tablet Commonly known as:  OSCAL WITH D Take 2 tablets by mouth daily.   chlorthalidone 25 MG tablet Commonly known as:  HYGROTON Take 25 mg by mouth daily.   clobetasol ointment 0.05 % Commonly known as:  TEMOVATE Apple topically to affected area BID for no more than 7 days.   D3-1000 PO Take 2 capsules by mouth daily.   diazepam 5 MG tablet Commonly known as:  VALIUM Take 5 mg by mouth every 6 (six) hours as needed for muscle spasms.   famciclovir 250 MG tablet Commonly known as:  FAMVIR Take 250 mg by mouth daily as needed (fever blisters).   fexofenadine 180 MG tablet Commonly known as:  ALLEGRA Take 180 mg by mouth daily as needed for allergies.   fish oil-omega-3 fatty acids 1000 MG capsule Take 2,000 mg by mouth daily.   fluticasone 50 MCG/ACT nasal spray Commonly known as:  FLONASE Place 2 sprays into both nostrils daily as needed for allergies.   ibuprofen 200 MG tablet Commonly  known as:  ADVIL,MOTRIN Take 200 mg by mouth every 6 (six) hours as needed.   losartan 50 MG tablet Commonly known as:  COZAAR Take 50 mg by mouth daily.   naproxen sodium 220 MG tablet Commonly known as:  ALEVE Take 220 mg by mouth daily as needed (pain).   QVAR 80 MCG/ACT inhaler Generic drug:  beclomethasone Inhale 2 puffs into the lungs 2 (two) times daily.   rosuvastatin 40 MG tablet Commonly known as:  CRESTOR Take 1 tablet by mouth daily.

## 2018-02-14 ENCOUNTER — Encounter: Payer: Self-pay | Admitting: Obstetrics & Gynecology

## 2018-02-14 ENCOUNTER — Other Ambulatory Visit: Payer: Self-pay

## 2018-02-14 ENCOUNTER — Ambulatory Visit: Payer: 59 | Admitting: Obstetrics & Gynecology

## 2018-02-14 ENCOUNTER — Encounter

## 2018-02-14 VITALS — BP 130/70 | HR 80 | Resp 14 | Ht 64.75 in | Wt 251.0 lb

## 2018-02-14 DIAGNOSIS — Z01419 Encounter for gynecological examination (general) (routine) without abnormal findings: Secondary | ICD-10-CM | POA: Diagnosis not present

## 2018-02-14 MED ORDER — CLOBETASOL PROPIONATE 0.05 % EX OINT: TOPICAL_OINTMENT | CUTANEOUS | 2 refills | Status: DC

## 2018-02-14 NOTE — Addendum Note (Signed)
Addended by: Megan Salon on: 02/14/2018 11:16 AM   Modules accepted: Orders

## 2018-02-14 NOTE — Progress Notes (Signed)
62 y.o. [id number] MarriedCaucasianF here for annual exam.  Doing well.  Denies vaginal bleeding.    Had sleep study done 6/19.  Diagnosed with OSA 8/19.  Saw Dr. Halford Chessman.  He did not start CPAP.    PCP:  Dr. Sandi Mariscal.  Has appt in a few months.  Will do blood work then.    Patient's last menstrual period was 06/25/2000.          Sexually active: Yes.    The current method of family planning is post menopausal status.    Exercising: No.   Smoker:  no  Health Maintenance: Pap:  11/30/16 Neg. HR HPV:neg   06/22/14 Neg  History of abnormal Pap:  no MMG:  06/14/17 BIRADS1:Neg Colonoscopy:  11/21/13 polyp. F/u 5 years BMD:   03/15/15 Osteopenia  TDaP:  PCP Pneumonia vaccine(s):  Done with PCP Shingrix: completed  Hep C testing: PCP Screening Labs: PCP   reports that she has never smoked. She has never used smokeless tobacco. She reports that she drinks about 1.0 - 2.0 standard drinks of alcohol per week. She reports that she does not use drugs.  Past Medical History:  Diagnosis Date  . Asthma   . Blood transfusion without reported diagnosis 1973  . Fracture of ankle, left, closed 05/2015  . History of colon polyps 01/2008   Dr. Idolina Primer due 2015  . Hyperlipidemia   . Hypertension   . Prediabetes   . Restless leg syndrome   . Sleep apnea    Mild    Past Surgical History:  Procedure Laterality Date  . ANKLE FRACTURE SURGERY Left 05/2015  . BREAST SURGERY  09/2009   benign bx--fibrocystic changes  . L5/S1 discectomy  1973  . Left eye surgery for a retinal tear      Current Outpatient Medications  Medication Sig Dispense Refill  . albuterol (PROVENTIL HFA;VENTOLIN HFA) 108 (90 BASE) MCG/ACT inhaler Inhale 2 puffs into the lungs every 6 (six) hours as needed.    . ALPRAZolam (XANAX) 0.5 MG tablet Take 0.5 mg by mouth 2 (two) times daily as needed for sleep.     Marland Kitchen aspirin 81 MG tablet Take 81 mg by mouth daily.    . calcium-vitamin D (OSCAL WITH D) 500-200 MG-UNIT per tablet Take  2 tablets by mouth daily.     . chlorthalidone (HYGROTON) 25 MG tablet Take 25 mg by mouth daily.     . Cholecalciferol (D3-1000 PO) Take 2 capsules by mouth daily.    . clobetasol ointment (TEMOVATE) 0.05 % Apple topically to affected area BID for no more than 7 days. 60 g 1  . diazepam (VALIUM) 5 MG tablet Take 5 mg by mouth every 6 (six) hours as needed for muscle spasms.     . famciclovir (FAMVIR) 250 MG tablet Take 250 mg by mouth daily as needed (fever blisters).     . fexofenadine (ALLEGRA) 180 MG tablet Take 180 mg by mouth daily as needed for allergies.     . fish oil-omega-3 fatty acids 1000 MG capsule Take 2,000 mg by mouth daily.     . fluticasone (FLONASE) 50 MCG/ACT nasal spray Place 2 sprays into both nostrils daily as needed for allergies.     Marland Kitchen ibuprofen (ADVIL,MOTRIN) 200 MG tablet Take 200 mg by mouth every 6 (six) hours as needed.    Marland Kitchen losartan (COZAAR) 50 MG tablet Take 50 mg by mouth daily.    . naproxen sodium (ANAPROX) 220 MG tablet Take 220  mg by mouth daily as needed (pain).     Marland Kitchen QVAR 80 MCG/ACT inhaler Inhale 2 puffs into the lungs 2 (two) times daily.     . rosuvastatin (CRESTOR) 40 MG tablet Take 1 tablet by mouth daily.     No current facility-administered medications for this visit.     Family History  Problem Relation Age of Onset  . Cardiomyopathy Sister 41       died of complications of cardiomyopathy  . Hypertension Sister   . Coronary artery disease Father 85       CABG  . Hypertension Father   . Colon cancer Paternal Grandmother   . Diabetes Brother   . Hypertension Brother   . Hypertension Mother     Review of Systems  Constitutional: Positive for weight loss.  Musculoskeletal: Positive for myalgias.  All other systems reviewed and are negative.   Exam:   BP 130/70 (BP Location: Right Arm, Patient Position: Sitting, Cuff Size: Large)   Pulse 80   Resp 14   Ht 5' 4.75" (1.645 m)   Wt 251 lb (113.9 kg)   LMP 06/25/2000   BMI 42.09  kg/m   Height: -10#   Height: 5' 4.75" (164.5 cm)  Ht Readings from Last 3 Encounters:  02/14/18 5' 4.75" (1.645 m)  02/07/18 5\' 5"  (1.651 m)  12/17/17 5\' 5"  (1.651 m)    General appearance: alert, cooperative and appears stated age Head: Normocephalic, without obvious abnormality, atraumatic Neck: no adenopathy, supple, symmetrical, trachea midline and thyroid normal to inspection and palpation Lungs: clear to auscultation bilaterally Breasts: normal appearance, no masses or tenderness Heart: regular rate and rhythm Abdomen: soft, non-tender; bowel sounds normal; no masses,  no organomegaly Extremities: extremities normal, atraumatic, no cyanosis or edema Skin: Skin color, texture, turgor normal. No rashes or lesions Lymph nodes: Cervical, supraclavicular, and axillary nodes normal. No abnormal inguinal nodes palpated Neurologic: Grossly normal   Pelvic: External genitalia:  no lesions              Urethra:  normal appearing urethra with no masses, tenderness or lesions              Bartholins and Skenes: normal                 Vagina: normal appearing vagina with normal color and discharge, no lesions              Cervix: no lesions              Pap taken: No. Bimanual Exam:  Uterus:  normal size, contour, position, consistency, mobility, non-tender              Adnexa: normal adnexa and no mass, fullness, tenderness               Rectovaginal: Confirms               Anus:  normal sphincter tone, no lesions  Chaperone was present for exam.  A:  Well Woman with normal exam PMP, HRT H/O colonic polyps.  Followed by Dr. Cristina Gong. OAB Lichen sclerosus   P:   Mammogram guidelines reviewed. pap smear with neg HR HPV neg 2018.  Not obtained today. RF for clobetasol 0.05% ointment bid for flares for up to 7 days.  Currently using every 4-5 weeks.  Lab work planned with Dr Sandi Mariscal Return annually or prn

## 2018-07-01 ENCOUNTER — Encounter: Payer: Self-pay | Admitting: Obstetrics & Gynecology

## 2018-09-18 ENCOUNTER — Encounter: Payer: Self-pay | Admitting: Obstetrics & Gynecology

## 2018-09-18 ENCOUNTER — Other Ambulatory Visit: Payer: Self-pay | Admitting: Obstetrics & Gynecology

## 2018-09-18 MED ORDER — CLOBETASOL PROPIONATE 0.05 % EX OINT: TOPICAL_OINTMENT | CUTANEOUS | 1 refills | Status: DC

## 2018-12-25 ENCOUNTER — Other Ambulatory Visit: Payer: Self-pay

## 2018-12-30 ENCOUNTER — Other Ambulatory Visit: Payer: Self-pay

## 2018-12-30 ENCOUNTER — Ambulatory Visit (INDEPENDENT_AMBULATORY_CARE_PROVIDER_SITE_OTHER): Payer: 59

## 2018-12-30 ENCOUNTER — Ambulatory Visit: Payer: 59 | Admitting: Obstetrics & Gynecology

## 2018-12-30 ENCOUNTER — Encounter: Payer: Self-pay | Admitting: Obstetrics & Gynecology

## 2018-12-30 VITALS — BP 110/74 | HR 62 | Temp 98.0°F | Resp 14 | Ht 64.5 in | Wt 258.0 lb

## 2018-12-30 DIAGNOSIS — R102 Pelvic and perineal pain: Secondary | ICD-10-CM

## 2018-12-30 MED ORDER — EZETIMIBE 10 MG PO TABS: 10.0000 mg | ORAL_TABLET | Freq: Every day | ORAL | 0 refills | Status: DC

## 2018-12-30 NOTE — Addendum Note (Signed)
Addended by: Megan Salon on: 12/30/2018 09:59 AM   Modules accepted: Orders

## 2018-12-30 NOTE — Progress Notes (Addendum)
Pt was able to return for PUS today.    Uterus:  6.1 x 4.5 x 2.6cm Endometrium:  2.51mm Left ovary:  1.2 x 1.1 x 0.6cm Right ovary:  1.2 x 0.6 x 0.7 Cul de sac:  No free fluid  Images reviewed with pt today.  Pt aware ovaries are normal and there are no abnormalities today on ultrasound.  She is going to proceed with CT with Dr. Sandi Mariscal.

## 2018-12-30 NOTE — Progress Notes (Signed)
GYNECOLOGY  VISIT  CC:   Refurred from Dr. Janyth Contes   HPI: 63 y.o. G2P2002 Married White or Caucasian female here for a dull ache that has been present for months.  She had a significant URI in early June.  She saw Dr. Sandi Mariscal and had a urine culture showed e coli that was sensitive to all antibiotics that were tested.  She was treated with cipro bid x 7 days.  There is no sharp pain.  She does have some issues with intermittent nausea.  Denies vaginal bleeding.  She is having some bowel changes with loser stools.  She does have a hx of colon polyps.  She is going to have a colonoscopy with Dr. Cristina Gong.  Dr. Sandi Mariscal wants her to have a PUS.  If this is negative, he is then going to plan at CT scan.    Urine looks clear and follow up micro and culture were both negative.  Pap negative 6/18.  GYNECOLOGIC HISTORY: Patient's last menstrual period was 06/25/2000. Contraception: post menopausal  Menopausal hormone therapy: yes  Patient Active Problem List   Diagnosis Date Noted  . Diastolic dysfunction 33/29/5188  . Chest pain 07/19/2011  . Shortness of breath 07/19/2011    Past Medical History:  Diagnosis Date  . Asthma   . Blood transfusion without reported diagnosis 1973  . Fracture of ankle, left, closed 05/2015  . History of colon polyps 01/2008   Dr. Idolina Primer due 2015  . Hyperlipidemia   . Hypertension   . Prediabetes   . Restless leg syndrome   . Sleep apnea    Mild    Past Surgical History:  Procedure Laterality Date  . ANKLE FRACTURE SURGERY Left 05/2015  . BREAST SURGERY  09/2009   benign bx--fibrocystic changes  . L5/S1 discectomy  1973  . Left eye surgery for a retinal tear      MEDS:   Current Outpatient Medications on File Prior to Visit  Medication Sig Dispense Refill  . calcium-vitamin D (OSCAL WITH D) 500-200 MG-UNIT per tablet Take 2 tablets by mouth daily.     . chlorthalidone (HYGROTON) 25 MG tablet Take 25 mg by mouth daily.     .  Cholecalciferol (D3-1000 PO) Take 2 capsules by mouth daily.    . clobetasol ointment (TEMOVATE) 0.05 % Apple topically to affected area BID for no more than 7 days. 60 g 1  . famciclovir (FAMVIR) 250 MG tablet Take 250 mg by mouth daily as needed (fever blisters).     . fexofenadine (ALLEGRA) 180 MG tablet Take 180 mg by mouth daily as needed for allergies.     . fish oil-omega-3 fatty acids 1000 MG capsule Take 2,000 mg by mouth daily.     . fluticasone (FLONASE) 50 MCG/ACT nasal spray Place 2 sprays into both nostrils daily as needed for allergies.     Marland Kitchen ibuprofen (ADVIL,MOTRIN) 200 MG tablet Take 200 mg by mouth every 6 (six) hours as needed.    Marland Kitchen albuterol (PROVENTIL HFA;VENTOLIN HFA) 108 (90 BASE) MCG/ACT inhaler Inhale 2 puffs into the lungs every 6 (six) hours as needed.    . ALPRAZolam (XANAX) 0.5 MG tablet Take 0.5 mg by mouth 2 (two) times daily as needed for sleep.     Marland Kitchen aspirin 81 MG tablet Take 81 mg by mouth daily.    Marland Kitchen losartan (COZAAR) 50 MG tablet Take 50 mg by mouth daily.    . naproxen sodium (ANAPROX) 220 MG tablet Take 220  mg by mouth daily as needed (pain).     . pantoprazole (PROTONIX) 40 MG tablet     . QVAR 80 MCG/ACT inhaler Inhale 2 puffs into the lungs 2 (two) times daily.     . rosuvastatin (CRESTOR) 40 MG tablet Take 1 tablet by mouth daily.     No current facility-administered medications on file prior to visit.     ALLERGIES: Shrimp [shellfish allergy] and Vicodin [hydrocodone-acetaminophen]  Family History  Problem Relation Age of Onset  . Cardiomyopathy Sister 62       died of complications of cardiomyopathy  . Hypertension Sister   . Coronary artery disease Father 24       CABG  . Hypertension Father   . Colon cancer Paternal Grandmother   . Diabetes Brother   . Hypertension Brother   . Hypertension Mother     SH:  Married, non smoker  Review of Systems  Constitutional: Negative.   Gastrointestinal:       Looser stools over the past few  weeks  Genitourinary: Positive for pelvic pain (pressure is present but not actual pain). Negative for dysuria, frequency, vaginal bleeding and vaginal discharge.    PHYSICAL EXAMINATION:    BP 110/74   Pulse 62   Temp 98 F (36.7 C)   Resp 14   Ht 5' 4.5" (1.638 m)   Wt 258 lb (117 kg)   LMP 06/25/2000   BMI 43.60 kg/m     General appearance: alert, cooperative and appears stated age Abdomen: soft, non-tender; bowel sounds normal; no masses,  no organomegaly Lymph:  no inguinal LAD noted  Pelvic: External genitalia:  no lesions              Urethra:  normal appearing urethra with no masses, tenderness or lesions              Bartholins and Skenes: normal                 Vagina: normal appearing vagina with normal color and discharge, no lesions              Cervix: no lesions              Bimanual Exam:  Uterus:  normal size, contour, position, consistency, mobility, non-tender              Adnexa: no mass, fullness, tenderness              Rectovaginal: Yes.  .  Confirms.              Anus:  normal sphincter tone, no lesions  Chaperone was present for exam.  Assessment: Pelvic pressure Bowel changes  Plan: Return for PUS She is scheduling a colonoscopy If PUS is negative, she will then have a CT scan with Dr. Sandi Mariscal

## 2019-04-15 DIAGNOSIS — R102 Pelvic and perineal pain: Secondary | ICD-10-CM | POA: Diagnosis not present

## 2019-04-15 DIAGNOSIS — R198 Other specified symptoms and signs involving the digestive system and abdomen: Secondary | ICD-10-CM | POA: Diagnosis not present

## 2019-04-15 DIAGNOSIS — Z86010 Personal history of colonic polyps: Secondary | ICD-10-CM | POA: Diagnosis not present

## 2019-05-25 DIAGNOSIS — Z1159 Encounter for screening for other viral diseases: Secondary | ICD-10-CM | POA: Diagnosis not present

## 2019-05-27 DIAGNOSIS — D123 Benign neoplasm of transverse colon: Secondary | ICD-10-CM | POA: Diagnosis not present

## 2019-05-27 DIAGNOSIS — Z86010 Personal history of colonic polyps: Secondary | ICD-10-CM | POA: Diagnosis not present

## 2019-05-27 DIAGNOSIS — D122 Benign neoplasm of ascending colon: Secondary | ICD-10-CM | POA: Diagnosis not present

## 2019-05-27 DIAGNOSIS — K5140 Inflammatory polyps of colon without complications: Secondary | ICD-10-CM | POA: Diagnosis not present

## 2019-05-27 DIAGNOSIS — K635 Polyp of colon: Secondary | ICD-10-CM | POA: Diagnosis not present

## 2019-05-27 DIAGNOSIS — D120 Benign neoplasm of cecum: Secondary | ICD-10-CM | POA: Diagnosis not present

## 2019-05-28 NOTE — Progress Notes (Signed)
63 y.o. G44P2002 Married White or Caucasian female here for annual exam.  Doing well.  Had colonoscopy 05/27/2019.  She had five polyps.  She will likely need a follow up in 3 years.    Denies vaginal bleeding.  Had pelvic pressure this summer.  Had been on antibiotics and had some stool changes.  Started taking Align and this helped.  She's now back to normal.    Not having any vulvar itching.  Typically has a little burning sensation from time to time.  Will use steroid then.  In a given month, she will use the steroid for two to three days.    PCP:  Dr. Sandi Mariscal.  Did blood work in January and has follow up scheduled in January as well.    Patient's last menstrual period was 06/25/2000.          Sexually active: Yes.    The current method of family planning is post menopausal status.    Exercising: No.  The patient does not participate in regular exercise at present. Smoker:  no  Health Maintenance: Pap:   11/30/16 Neg. HR HPV:neg              06/22/14 Neg  History of abnormal Pap:  no MMG:  07/01/18 Bi-rads 1 neg  Colonoscopy:   05/28/19 [facility]   BMD:     12/02/2018 mild osteopenia  TDaP:  Done with PCP Pneumonia vaccine(s):  Done with PCP Shingrix:   Done with PCP Hep C testing: PCP Screening Labs: PCP   reports that she has never smoked. She has never used smokeless tobacco. She reports current alcohol use of about 1.0 - 2.0 standard drinks of alcohol per week. She reports that she does not use drugs.  Past Medical History:  Diagnosis Date  . Asthma   . Blood transfusion without reported diagnosis 1973  . Fracture of ankle, left, closed 05/2015  . History of colon polyps 01/2008   Dr. Idolina Primer due 2015  . Hyperlipidemia   . Hypertension   . Prediabetes   . Restless leg syndrome   . Sleep apnea    Mild    Past Surgical History:  Procedure Laterality Date  . ANKLE FRACTURE SURGERY Left 05/2015  . BREAST SURGERY  09/2009   benign bx--fibrocystic changes   . L5/S1 discectomy  1973  . Left eye surgery for a retinal tear      Current Outpatient Medications  Medication Sig Dispense Refill  . albuterol (PROVENTIL HFA;VENTOLIN HFA) 108 (90 BASE) MCG/ACT inhaler Inhale 2 puffs into the lungs every 6 (six) hours as needed.    . ALPRAZolam (XANAX) 0.5 MG tablet Take 0.5 mg by mouth 2 (two) times daily as needed for sleep.     Marland Kitchen aspirin 81 MG tablet Take 81 mg by mouth daily.    . calcium-vitamin D (OSCAL WITH D) 500-200 MG-UNIT per tablet Take 2 tablets by mouth daily.     . chlorthalidone (HYGROTON) 25 MG tablet Take 25 mg by mouth daily.     . Cholecalciferol (D3-1000 PO) Take 2 capsules by mouth daily.    . clobetasol ointment (TEMOVATE) 0.05 % Apple topically to affected area BID for no more than 7 days. 60 g 1  . ezetimibe (ZETIA) 10 MG tablet Take 1 tablet (10 mg total) by mouth daily. 30 tablet 0  . famciclovir (FAMVIR) 250 MG tablet Take 250 mg by mouth daily as needed (fever blisters).     Marland Kitchen  fexofenadine (ALLEGRA) 180 MG tablet Take 180 mg by mouth daily as needed for allergies.     . fish oil-omega-3 fatty acids 1000 MG capsule Take 2,000 mg by mouth daily.     . fluticasone (FLONASE) 50 MCG/ACT nasal spray Place 2 sprays into both nostrils daily as needed for allergies.     Marland Kitchen ibuprofen (ADVIL,MOTRIN) 200 MG tablet Take 200 mg by mouth every 6 (six) hours as needed.    Marland Kitchen losartan (COZAAR) 50 MG tablet Take 50 mg by mouth daily.    . naproxen sodium (ANAPROX) 220 MG tablet Take 220 mg by mouth daily as needed (pain).     . pantoprazole (PROTONIX) 40 MG tablet     . QVAR 80 MCG/ACT inhaler Inhale 2 puffs into the lungs 2 (two) times daily.     . rosuvastatin (CRESTOR) 40 MG tablet Take 1 tablet by mouth daily.     No current facility-administered medications for this visit.     Family History  Problem Relation Age of Onset  . Cardiomyopathy Sister 3       died of complications of cardiomyopathy  . Hypertension Sister   . Coronary  artery disease Father 46       CABG  . Hypertension Father   . Colon cancer Paternal Grandmother   . Diabetes Brother   . Hypertension Brother   . Hypertension Mother     Review of Systems  All other systems reviewed and are negative.   Exam:   Vitals:   06/05/19 0929  BP: 130/80  Pulse: 71  Temp: (!) 96.7 F (35.9 C)  SpO2: 95%    General appearance: alert, cooperative and appears stated age Head: Normocephalic, without obvious abnormality, atraumatic Neck: no adenopathy, supple, symmetrical, trachea midline and thyroid normal to inspection and palpation Lungs: clear to auscultation bilaterally Breasts: normal appearance, no masses or tenderness Heart: regular rate and rhythm Abdomen: soft, non-tender; bowel sounds normal; no masses,  no organomegaly Extremities: extremities normal, atraumatic, no cyanosis or edema Skin: Skin color, texture, turgor normal. No rashes or lesions Lymph nodes: Cervical, supraclavicular, and axillary nodes normal. No abnormal inguinal nodes palpated Neurologic: Grossly normal   Pelvic: External genitalia:  no lesions              Urethra:  normal appearing urethra with no masses, tenderness or lesions              Bartholins and Skenes: normal                 Vagina: normal appearing vagina with normal color and discharge, no lesions              Cervix: no lesions              Pap taken: No. Bimanual Exam:  Uterus:  normal size, contour, position, consistency, mobility, non-tender              Adnexa: normal adnexa and no mass, fullness, tenderness               Rectovaginal: deferred today               Anus:  normal sphincter tone, no lesions  Chaperone was present for exam.  A:  Well Woman with normal exam PMP, no HRT H/o colon polyps, followed by Dr. Cristina Gong OAB H/O lichen sclerosus  P:   Mammogram guidelines reviewed.  This is due in January. pap smear with neg HR HPV  11/2016.  This is not due today.  Guidelines  reviewed. Lab work and vaccines are UTD Release of colonoscopy will be signed today. RF for Clobetasol ointment 0.05% bid for a few days when has symptoms.   Return annually or prn

## 2019-05-29 DIAGNOSIS — K635 Polyp of colon: Secondary | ICD-10-CM | POA: Diagnosis not present

## 2019-05-29 DIAGNOSIS — D123 Benign neoplasm of transverse colon: Secondary | ICD-10-CM | POA: Diagnosis not present

## 2019-05-29 DIAGNOSIS — K5140 Inflammatory polyps of colon without complications: Secondary | ICD-10-CM | POA: Diagnosis not present

## 2019-05-29 DIAGNOSIS — D120 Benign neoplasm of cecum: Secondary | ICD-10-CM | POA: Diagnosis not present

## 2019-05-29 DIAGNOSIS — D122 Benign neoplasm of ascending colon: Secondary | ICD-10-CM | POA: Diagnosis not present

## 2019-06-03 ENCOUNTER — Other Ambulatory Visit: Payer: Self-pay

## 2019-06-05 ENCOUNTER — Encounter: Payer: Self-pay | Admitting: Obstetrics & Gynecology

## 2019-06-05 ENCOUNTER — Ambulatory Visit (INDEPENDENT_AMBULATORY_CARE_PROVIDER_SITE_OTHER): Payer: PPO | Admitting: Obstetrics & Gynecology

## 2019-06-05 ENCOUNTER — Other Ambulatory Visit: Payer: Self-pay

## 2019-06-05 VITALS — BP 130/80 | HR 71 | Temp 96.7°F | Ht 64.5 in | Wt 260.0 lb

## 2019-06-05 DIAGNOSIS — Z01419 Encounter for gynecological examination (general) (routine) without abnormal findings: Secondary | ICD-10-CM

## 2019-06-05 MED ORDER — CLOBETASOL PROPIONATE 0.05 % EX OINT: TOPICAL_OINTMENT | CUTANEOUS | 1 refills | Status: DC

## 2019-07-13 DIAGNOSIS — Z0184 Encounter for antibody response examination: Secondary | ICD-10-CM | POA: Diagnosis not present

## 2019-07-13 DIAGNOSIS — Z0000 Encounter for general adult medical examination without abnormal findings: Secondary | ICD-10-CM | POA: Diagnosis not present

## 2019-07-14 ENCOUNTER — Encounter: Payer: Self-pay | Admitting: Obstetrics & Gynecology

## 2019-07-14 DIAGNOSIS — Z1231 Encounter for screening mammogram for malignant neoplasm of breast: Secondary | ICD-10-CM | POA: Diagnosis not present

## 2019-07-23 DIAGNOSIS — I5032 Chronic diastolic (congestive) heart failure: Secondary | ICD-10-CM | POA: Diagnosis not present

## 2019-07-23 DIAGNOSIS — Z23 Encounter for immunization: Secondary | ICD-10-CM | POA: Diagnosis not present

## 2019-07-23 DIAGNOSIS — E782 Mixed hyperlipidemia: Secondary | ICD-10-CM | POA: Diagnosis not present

## 2019-07-23 DIAGNOSIS — I110 Hypertensive heart disease with heart failure: Secondary | ICD-10-CM | POA: Diagnosis not present

## 2019-07-23 DIAGNOSIS — R1030 Lower abdominal pain, unspecified: Secondary | ICD-10-CM | POA: Diagnosis not present

## 2019-07-23 DIAGNOSIS — I351 Nonrheumatic aortic (valve) insufficiency: Secondary | ICD-10-CM | POA: Diagnosis not present

## 2019-07-23 DIAGNOSIS — U071 COVID-19: Secondary | ICD-10-CM | POA: Diagnosis not present

## 2019-07-23 DIAGNOSIS — Z0000 Encounter for general adult medical examination without abnormal findings: Secondary | ICD-10-CM | POA: Diagnosis not present

## 2019-07-23 DIAGNOSIS — G4733 Obstructive sleep apnea (adult) (pediatric): Secondary | ICD-10-CM | POA: Diagnosis not present

## 2019-07-23 DIAGNOSIS — I340 Nonrheumatic mitral (valve) insufficiency: Secondary | ICD-10-CM | POA: Diagnosis not present

## 2019-07-27 DIAGNOSIS — H43393 Other vitreous opacities, bilateral: Secondary | ICD-10-CM | POA: Diagnosis not present

## 2019-07-27 DIAGNOSIS — H33002 Unspecified retinal detachment with retinal break, left eye: Secondary | ICD-10-CM | POA: Diagnosis not present

## 2019-07-27 DIAGNOSIS — H5213 Myopia, bilateral: Secondary | ICD-10-CM | POA: Diagnosis not present

## 2019-07-27 DIAGNOSIS — H1033 Unspecified acute conjunctivitis, bilateral: Secondary | ICD-10-CM | POA: Diagnosis not present

## 2019-07-27 DIAGNOSIS — H52223 Regular astigmatism, bilateral: Secondary | ICD-10-CM | POA: Diagnosis not present

## 2019-07-27 DIAGNOSIS — H524 Presbyopia: Secondary | ICD-10-CM | POA: Diagnosis not present

## 2019-07-27 DIAGNOSIS — H43813 Vitreous degeneration, bilateral: Secondary | ICD-10-CM | POA: Diagnosis not present

## 2019-08-21 ENCOUNTER — Ambulatory Visit: Payer: PPO | Attending: Internal Medicine

## 2019-08-21 DIAGNOSIS — Z23 Encounter for immunization: Secondary | ICD-10-CM | POA: Insufficient documentation

## 2019-08-21 NOTE — Progress Notes (Signed)
   Covid-19 Vaccination Clinic  Name:  Tracy Cabrera    MRN: [id number] DOB: 25-Nov-1955  08/21/2019  [name] was observed post Covid-19 immunization for 15 minutes without incidence. She was provided with Vaccine Information Sheet and instruction to access the V-Safe system.   [name] was instructed to call 911 with any severe reactions post vaccine: Marland Kitchen Difficulty breathing  . Swelling of your face and throat  . A fast heartbeat  . A bad rash all over your body  . Dizziness and weakness    Immunizations Administered    Name Date Dose VIS Date Route   Pfizer COVID-19 Vaccine 08/21/2019 10:48 AM 0.3 mL 06/05/2019 Intramuscular   Manufacturer: [place]   Lot: [id number]   [place]: [id number]

## 2019-09-16 ENCOUNTER — Ambulatory Visit: Payer: PPO | Attending: Internal Medicine

## 2019-09-16 DIAGNOSIS — Z23 Encounter for immunization: Secondary | ICD-10-CM

## 2019-09-16 NOTE — Progress Notes (Signed)
   Covid-19 Vaccination Clinic  Name:  Tracy Cabrera    MRN: [id number] DOB: Mar 23, 1956  09/16/2019  [name] was observed post Covid-19 immunization for 15 minutes without incident. She was provided with Vaccine Information Sheet and instruction to access the V-Safe system.   [name] was instructed to call 911 with any severe reactions post vaccine: Marland Kitchen Difficulty breathing  . Swelling of face and throat  . A fast heartbeat  . A bad rash all over body  . Dizziness and weakness   Immunizations Administered    Name Date Dose VIS Date Route   Pfizer COVID-19 Vaccine 09/16/2019 10:01 AM 0.3 mL 06/05/2019 Intramuscular   Manufacturer: [place]   Lot: [id number]   [place]: [id number]

## 2020-01-26 ENCOUNTER — Other Ambulatory Visit: Payer: Self-pay | Admitting: Otolaryngology

## 2020-01-26 DIAGNOSIS — H918X9 Other specified hearing loss, unspecified ear: Secondary | ICD-10-CM

## 2020-01-26 DIAGNOSIS — H9042 Sensorineural hearing loss, unilateral, left ear, with unrestricted hearing on the contralateral side: Secondary | ICD-10-CM | POA: Diagnosis not present

## 2020-02-11 DIAGNOSIS — H2513 Age-related nuclear cataract, bilateral: Secondary | ICD-10-CM | POA: Diagnosis not present

## 2020-02-11 DIAGNOSIS — H52223 Regular astigmatism, bilateral: Secondary | ICD-10-CM | POA: Diagnosis not present

## 2020-02-11 DIAGNOSIS — H524 Presbyopia: Secondary | ICD-10-CM | POA: Diagnosis not present

## 2020-02-11 DIAGNOSIS — H5213 Myopia, bilateral: Secondary | ICD-10-CM | POA: Diagnosis not present

## 2020-02-21 ENCOUNTER — Ambulatory Visit
Admission: RE | Admit: 2020-02-21 | Discharge: 2020-02-21 | Disposition: A | Discharge status: Home / Self Care | Payer: PPO | Source: Ambulatory Visit | Attending: Otolaryngology | Admitting: Otolaryngology

## 2020-02-21 DIAGNOSIS — J3489 Other specified disorders of nose and nasal sinuses: Secondary | ICD-10-CM | POA: Diagnosis not present

## 2020-02-21 DIAGNOSIS — J341 Cyst and mucocele of nose and nasal sinus: Secondary | ICD-10-CM | POA: Diagnosis not present

## 2020-02-21 DIAGNOSIS — H9192 Unspecified hearing loss, left ear: Secondary | ICD-10-CM | POA: Diagnosis not present

## 2020-02-21 DIAGNOSIS — H918X9 Other specified hearing loss, unspecified ear: Secondary | ICD-10-CM

## 2020-02-21 DIAGNOSIS — I6782 Cerebral ischemia: Secondary | ICD-10-CM | POA: Diagnosis not present

## 2020-02-21 MED ORDER — GADOBENATE DIMEGLUMINE 529 MG/ML IV SOLN
20.0000 mL | Freq: Once | INTRAVENOUS | Status: AC | PRN
  Administered 2020-02-21: 20 mL via INTRAVENOUS

## 2020-03-28 DIAGNOSIS — H524 Presbyopia: Secondary | ICD-10-CM | POA: Diagnosis not present

## 2020-03-28 DIAGNOSIS — H1131 Conjunctival hemorrhage, right eye: Secondary | ICD-10-CM | POA: Diagnosis not present

## 2020-03-28 DIAGNOSIS — H52223 Regular astigmatism, bilateral: Secondary | ICD-10-CM | POA: Diagnosis not present

## 2020-03-28 DIAGNOSIS — H5213 Myopia, bilateral: Secondary | ICD-10-CM | POA: Diagnosis not present

## 2020-03-29 DIAGNOSIS — H25013 Cortical age-related cataract, bilateral: Secondary | ICD-10-CM | POA: Diagnosis not present

## 2020-03-29 DIAGNOSIS — H18413 Arcus senilis, bilateral: Secondary | ICD-10-CM | POA: Diagnosis not present

## 2020-03-29 DIAGNOSIS — H2513 Age-related nuclear cataract, bilateral: Secondary | ICD-10-CM | POA: Diagnosis not present

## 2020-03-29 DIAGNOSIS — H2511 Age-related nuclear cataract, right eye: Secondary | ICD-10-CM | POA: Diagnosis not present

## 2020-03-29 DIAGNOSIS — H25043 Posterior subcapsular polar age-related cataract, bilateral: Secondary | ICD-10-CM | POA: Diagnosis not present

## 2020-03-30 DIAGNOSIS — H18413 Arcus senilis, bilateral: Secondary | ICD-10-CM | POA: Diagnosis not present

## 2020-03-30 DIAGNOSIS — H25043 Posterior subcapsular polar age-related cataract, bilateral: Secondary | ICD-10-CM | POA: Diagnosis not present

## 2020-03-30 DIAGNOSIS — H1045 Other chronic allergic conjunctivitis: Secondary | ICD-10-CM | POA: Diagnosis not present

## 2020-03-30 DIAGNOSIS — H2513 Age-related nuclear cataract, bilateral: Secondary | ICD-10-CM | POA: Diagnosis not present

## 2020-04-13 DIAGNOSIS — H18413 Arcus senilis, bilateral: Secondary | ICD-10-CM | POA: Diagnosis not present

## 2020-04-13 DIAGNOSIS — H1045 Other chronic allergic conjunctivitis: Secondary | ICD-10-CM | POA: Diagnosis not present

## 2020-04-13 DIAGNOSIS — H04123 Dry eye syndrome of bilateral lacrimal glands: Secondary | ICD-10-CM | POA: Diagnosis not present

## 2020-04-13 DIAGNOSIS — H2513 Age-related nuclear cataract, bilateral: Secondary | ICD-10-CM | POA: Diagnosis not present

## 2020-04-15 DIAGNOSIS — J309 Allergic rhinitis, unspecified: Secondary | ICD-10-CM | POA: Diagnosis not present

## 2020-04-15 DIAGNOSIS — Z20828 Contact with and (suspected) exposure to other viral communicable diseases: Secondary | ICD-10-CM | POA: Diagnosis not present

## 2020-04-22 DIAGNOSIS — H903 Sensorineural hearing loss, bilateral: Secondary | ICD-10-CM | POA: Diagnosis not present

## 2020-05-02 DIAGNOSIS — Z961 Presence of intraocular lens: Secondary | ICD-10-CM | POA: Diagnosis not present

## 2020-05-02 DIAGNOSIS — H2511 Age-related nuclear cataract, right eye: Secondary | ICD-10-CM | POA: Diagnosis not present

## 2020-05-02 DIAGNOSIS — Z9841 Cataract extraction status, right eye: Secondary | ICD-10-CM | POA: Diagnosis not present

## 2020-05-02 HISTORY — PX: CATARACT EXTRACTION: SUR2

## 2020-05-03 DIAGNOSIS — H2512 Age-related nuclear cataract, left eye: Secondary | ICD-10-CM | POA: Diagnosis not present

## 2020-05-16 ENCOUNTER — Encounter: Payer: Self-pay | Admitting: Cardiology

## 2020-05-16 ENCOUNTER — Other Ambulatory Visit: Payer: Self-pay

## 2020-05-16 ENCOUNTER — Ambulatory Visit: Payer: PPO | Admitting: Cardiology

## 2020-05-16 VITALS — BP 130/80 | HR 71 | Resp 16 | Ht 64.0 in | Wt 266.4 lb

## 2020-05-16 DIAGNOSIS — R0609 Other forms of dyspnea: Secondary | ICD-10-CM

## 2020-05-16 DIAGNOSIS — Z8249 Family history of ischemic heart disease and other diseases of the circulatory system: Secondary | ICD-10-CM

## 2020-05-16 DIAGNOSIS — I10 Essential (primary) hypertension: Secondary | ICD-10-CM

## 2020-05-16 DIAGNOSIS — R0600 Dyspnea, unspecified: Secondary | ICD-10-CM

## 2020-05-16 DIAGNOSIS — E7800 Pure hypercholesterolemia, unspecified: Secondary | ICD-10-CM

## 2020-05-16 MED ORDER — LOSARTAN POTASSIUM 100 MG PO TABS: 100.0000 mg | ORAL_TABLET | Freq: Every evening | ORAL | 3 refills | Status: DC

## 2020-05-16 NOTE — Progress Notes (Signed)
Primary Physician/Referring:  Derinda Late, MD  Patient ID: Tracy Cabrera, female    DOB: 10-Nov-1955, 64 y.o.   MRN: [id number]  Chief Complaint  Patient presents with  . New Patient (Initial Visit)  . Dystolic Dysfunction   HPI:    Tracy Cabrera  is a 64 y.o. Caucasian female with a strong family history of premature coronary disease in father at age 69 had CABG, her brother recently had stenting in his age 56 to 61 years, family history of "cardiomyopathy" in her sister who died at the age of 74 years in 1995, details not available, no other family members with sudden cardiac death or recurrent syncope referred to me for evaluation of shortness of breath and cardiac stratification.  Past medical history significant for hypertension and hyperlipidemia.  She has had sensorineural hearing loss diagnosed 6 months ago,  diagnosed with obstructive sleep apnea, mild in 2018 and grade 2 diastolic dysfunction by echocardiogram in 2019, hypertension and hyperlipidemia.  COVID-19 infection in December 2020, she is now fully vaccinated.  Patient states that over the past few years she has noticed gradually worsening dyspnea on exertion especially in the last 5 to 6 months.  She also states that her activity level has reduced as she had adopted two children (children of her nephew).  She is still working full-time as an Optometrist.  She denies chest pain, leg edema except at the prior injury site in the left ankle, palpitations, dizziness or syncope.  She has not had any chest pain.  Past Medical History:  Diagnosis Date  . Asthma   . Blood transfusion without reported diagnosis 1973  . Fracture of ankle, left, closed 05/2015  . History of colon polyps 01/2008   Dr. Idolina Primer due 2015  . Hyperlipidemia   . Hypertension   . Prediabetes   . Restless leg syndrome   . Sleep apnea    Mild   Past Surgical History:  Procedure Laterality Date  . ANKLE FRACTURE SURGERY Left 05/2015  . BREAST  SURGERY  09/2009   benign bx--fibrocystic changes  . L5/S1 discectomy  1973  . Left eye surgery for a retinal tear     Family History  Problem Relation Age of Onset  . Cardiomyopathy Sister 8       died of complications of cardiomyopathy  . Hypertension Sister   . Coronary artery disease Father 16       CABG  . Hypertension Father   . Colon cancer Paternal Grandmother   . Diabetes Brother   . Hypertension Brother   . Hypertension Mother     Social History   Tobacco Use  . Smoking status: Never Smoker  . Smokeless tobacco: Never Used  . Tobacco comment: lives with a heavy smoker  Substance Use Topics  . Alcohol use: Yes    Alcohol/week: 1.0 - 2.0 standard drink    Types: 1 - 2 Glasses of wine per week    Comment: 1-2 per month   Marital Status: Married  ROS  Review of Systems  Cardiovascular: Positive for dyspnea on exertion and leg swelling (left ankle). Negative for chest pain.  Gastrointestinal: Negative for melena.   Objective  Blood pressure 130/80, pulse 71, resp. rate 16, height 5' 4"  (1.626 m), weight 266 lb 6.4 oz (120.8 kg), last menstrual period 06/25/2000, SpO2 96 %.  Vitals with BMI 05/16/2020 05/16/2020 06/05/2019  Height - 5' 4"  5' 4.5"  Weight - 266 lbs 6 oz 260 lbs  BMI - 06.0 15.61  Systolic 537 943 276  Diastolic 80 86 80  Pulse - 71 71     Physical Exam Constitutional:      Appearance: She is obese.  Cardiovascular:     Rate and Rhythm: Normal rate and regular rhythm.     Pulses: Intact distal pulses.     Heart sounds: Normal heart sounds. No murmur heard.  No gallop.      Comments: No leg edema, no JVD. Pulmonary:     Effort: Pulmonary effort is normal.     Breath sounds: Normal breath sounds.  Abdominal:     General: Bowel sounds are normal.     Palpations: Abdomen is soft.    Laboratory examination:   No results for input(s): NA, K, CL, CO2, GLUCOSE, BUN, CREATININE, CALCIUM, GFRNONAA, GFRAA in the last 8760 hours. CrCl cannot be  calculated (No successful lab value found.).  No flowsheet data found. No flowsheet data found.  Lipid Panel No results for input(s): CHOL, TRIG, LDLCALC, VLDL, HDL, CHOLHDL, LDLDIRECT in the last 8760 hours.  HEMOGLOBIN A1C No results found for: HGBA1C, MPG TSH No results for input(s): TSH in the last 8760 hours.  External labs:   Labs 07/13/2019:  Total cholesterol 116, triglycerides 109, HDL 50, LDL 47.  Normal particle number and size.  High sensitive CRP >10.0.  Vitamin D 44.2.  A1c 6.4%.  BUN 17, creatinine 0.92, EGFR 65 mL, potassium 3.9, sodium 139, CMP normal.  Hb 13.8/HCT 41.6, platelets 233.  Medications and allergies   Allergies  Allergen Reactions  . Shrimp [Shellfish Allergy] Hives and Swelling  . Vicodin [Hydrocodone-Acetaminophen]     Nausea      Outpatient Medications Prior to Visit  Medication Sig Dispense Refill  . albuterol (PROVENTIL HFA;VENTOLIN HFA) 108 (90 BASE) MCG/ACT inhaler Inhale 2 puffs into the lungs every 6 (six) hours as needed.    . ALPRAZolam (XANAX) 0.5 MG tablet Take 0.5 mg by mouth 2 (two) times daily as needed for sleep.     . Bacillus Coagulans-Inulin (ALIGN PREBIOTIC-PROBIOTIC PO)     . calcium-vitamin D (OSCAL WITH D) 500-200 MG-UNIT per tablet Take 2 tablets by mouth daily.     . chlorthalidone (HYGROTON) 25 MG tablet Take 25 mg by mouth daily.     . Cholecalciferol (D3-1000 PO) Take 2 capsules by mouth daily.    Marland Kitchen ezetimibe (ZETIA) 10 MG tablet Take 1 tablet (10 mg total) by mouth daily. 30 tablet 0  . famciclovir (FAMVIR) 250 MG tablet Take 250 mg by mouth daily as needed (fever blisters).     . fexofenadine (ALLEGRA) 180 MG tablet Take 180 mg by mouth daily as needed for allergies.     . fish oil-omega-3 fatty acids 1000 MG capsule Take 2,000 mg by mouth daily.     Marland Kitchen FLOVENT HFA 110 MCG/ACT inhaler Inhale 2 puffs into the lungs 2 (two) times daily.    . fluticasone (FLONASE) 50 MCG/ACT nasal spray Place 2 sprays into  both nostrils daily as needed for allergies.     Marland Kitchen ibuprofen (ADVIL,MOTRIN) 200 MG tablet Take 200 mg by mouth every 6 (six) hours as needed.    . naproxen sodium (ANAPROX) 220 MG tablet Take 220 mg by mouth daily as needed (arthritis).     . rosuvastatin (CRESTOR) 40 MG tablet Take 1 tablet by mouth daily.    . Zinc 50 MG TABS     . losartan (COZAAR) 50 MG tablet Take  50 mg by mouth daily.    Marland Kitchen aspirin 81 MG tablet Take 81 mg by mouth daily. (Patient not taking: Reported on 05/16/2020)    . clobetasol ointment (TEMOVATE) 0.05 % Apple topically to affected area BID for no more than 7 days. (Patient not taking: Reported on 05/16/2020) 60 g 1  . QVAR 80 MCG/ACT inhaler Inhale 2 puffs into the lungs 2 (two) times daily.  (Patient not taking: Reported on 05/16/2020)     No facility-administered medications prior to visit.   Radiology:   No results found.  Cardiac Studies:   Lexiscan sestamibi stress test 03/06/2016:  Nondiagnostic EKG.  The left ventricular ejection fraction is hyperdynamic (>65%).  Nuclear stress EF: 66%.  There was no ST segment deviation noted during stress.  The study is normal.  This is a low risk study.  Echocardiogram 03/05/2016: - Left ventricle: The cavity size was normal. Wall thickness was   normal. Systolic function was normal. The estimated ejection   fraction was in the range of 60% to 65%. Features are consistent   with a pseudonormal left ventricular filling pattern, with   concomitant abnormal relaxation and increased filling pressure   (grade 2 diastolic dysfunction). - Aortic valve: There was mild regurgitation. - Mitral valve: There was mild regurgitation.  ABI 07/13/2019 done at PCP office: Bilateral ABI 1.08.  EKG:   EKG 05/16/2020: Normal sinus rhythm at rate of 64 bpm, normal axis.  No evidence of ischemia, normal EKG.     Assessment     ICD-10-CM   1. Dyspnea on exertion  R06.00 PCV MYOCARDIAL PERFUSION WO LEXISCAN    PCV  ECHOCARDIOGRAM COMPLETE    CT CARDIAC SCORING    losartan (COZAAR) 100 MG tablet    DG Chest 2 View  2. Primary hypertension  I10 EKG 12-Lead    PCV MYOCARDIAL PERFUSION WO LEXISCAN    PCV ECHOCARDIOGRAM COMPLETE    Basic metabolic panel  3. Hypercholesteremia  E78.00 PCV MYOCARDIAL PERFUSION WO LEXISCAN  4. Family history of premature CAD  Z82.49 PCV MYOCARDIAL PERFUSION WO LEXISCAN    CT CARDIAC SCORING    losartan (COZAAR) 100 MG tablet   Cardiomyopathy (age of onset: 47) in her sister died in 80; Coronary artery disease in father (age of onset 66Y)    Medications Discontinued During This Encounter  Medication Reason  . QVAR 80 MCG/ACT inhaler Change in therapy  . aspirin 81 MG tablet Error  . clobetasol ointment (TEMOVATE) 0.05 % Error  . losartan (COZAAR) 50 MG tablet Reorder    Meds ordered this encounter  Medications  . losartan (COZAAR) 100 MG tablet    Sig: Take 1 tablet (100 mg total) by mouth every evening.    Dispense:  90 tablet    Refill:  3   Recommendations:   Tracy Cabrera is a 64 y.o. Caucasian female with a strong family history of premature coronary disease in father at age 78 had CABG, her brother recently had stenting in his age 71 to 61 years, family history of "cardiomyopathy" in her sister who died at the age of 65 years in 1995, details not available, no other family members with sudden cardiac death or recurrent syncope referred to me for evaluation of shortness of breath and cardiac stratification.  Past medical history significant for hypertension and hyperlipidemia.  She has had sensorineural hearing loss diagnosed 6 months ago,  diagnosed with obstructive sleep apnea, mild in 2018 and grade 2 diastolic dysfunction by  echocardiogram in 2019, hypertension and hyperlipidemia.  COVID-19 infection in December 2020, she is now fully vaccinated.  Her dyspnea may be anginal equivalent especially in view of very strong family history of premature coronary  disease.  Her activity is also significantly reduced due to obesity and also degenerative joint disease involving the knee.  Although she has had a stress test 2 years ago, it may be worthwhile to repeat the stress test and also repeat echocardiogram to follow-up on diastolic dysfunction and also valvular heart disease.  I will also obtain coronary calcium score for further cardiac stratification.  Her blood pressure was elevated today, will increase losartan from 50 mg to 100 mg daily, obtain BMP in 2 weeks.  I will see her back in 6 weeks for follow-up.  I do not have all her recent labs and they are being scanned, also do not know if she has had a recent chest x-ray and may consider this if she has not had a recent chest x-ray. Addend: Labs reviewed, lipids normal, CMP and CBC normal.  Has hyperglycemia.  Chest x-ray not performed recently, will order.   Adrian Prows, MD, [facility] 05/17/2020, 11:47 AM Office: [phone]

## 2020-05-16 NOTE — Patient Instructions (Signed)
Diastolic function means the heart has to relax to receive the blood so it can pump the blood out. If relaxation is impaired, then the blood coming to the heart is restricted and can cause dyspnea and reduced functional capacity and fluid build up. Exercise, weight loss, control of BP, salt restriction will improve this.   It was great to see you today.   Adrian Prows, MD, [facility] 05/16/2020, 12:34 PM Office: [phone] Pager: [phone]

## 2020-05-17 ENCOUNTER — Other Ambulatory Visit: Payer: PPO

## 2020-05-24 ENCOUNTER — Ambulatory Visit: Payer: PPO

## 2020-05-24 ENCOUNTER — Other Ambulatory Visit: Payer: Self-pay

## 2020-05-24 DIAGNOSIS — I10 Essential (primary) hypertension: Secondary | ICD-10-CM

## 2020-05-24 DIAGNOSIS — R0609 Other forms of dyspnea: Secondary | ICD-10-CM

## 2020-05-24 DIAGNOSIS — R0600 Dyspnea, unspecified: Secondary | ICD-10-CM

## 2020-05-25 LAB — BASIC METABOLIC PANEL
BUN/Creatinine Ratio: 21 (ref 12–28)
BUN: 16 mg/dL (ref 8–27)
CO2: 26 mmol/L (ref 20–29)
Calcium: 9.2 mg/dL (ref 8.7–10.3)
Chloride: 101 mmol/L (ref 96–106)
Creatinine, Ser: 0.78 mg/dL (ref 0.57–1.00)
GFR calc Af Amer: 92 mL/min/{1.73_m2} (ref >59)
GFR calc non Af Amer: 79 mL/min/{1.73_m2} (ref >59)
Glucose: 109 mg/dL — ABNORMAL HIGH (ref 65–99)
Potassium: 3.9 mmol/L (ref 3.5–5.2)
Sodium: 141 mmol/L (ref 134–144)

## 2020-05-30 ENCOUNTER — Ambulatory Visit: Payer: PPO

## 2020-05-30 ENCOUNTER — Other Ambulatory Visit: Payer: Self-pay

## 2020-05-30 DIAGNOSIS — R0609 Other forms of dyspnea: Secondary | ICD-10-CM

## 2020-05-30 DIAGNOSIS — R0600 Dyspnea, unspecified: Secondary | ICD-10-CM

## 2020-05-30 DIAGNOSIS — I10 Essential (primary) hypertension: Secondary | ICD-10-CM

## 2020-05-30 DIAGNOSIS — E7800 Pure hypercholesterolemia, unspecified: Secondary | ICD-10-CM

## 2020-05-30 DIAGNOSIS — Z8249 Family history of ischemic heart disease and other diseases of the circulatory system: Secondary | ICD-10-CM

## 2020-05-31 NOTE — Progress Notes (Signed)
Low risk stress test. Soft tissue attenuation

## 2020-06-02 ENCOUNTER — Telehealth: Payer: Self-pay | Admitting: Cardiology

## 2020-06-02 NOTE — Telephone Encounter (Signed)
[facility] called and advised that they have a emergent Calcium score and provided the message " Total Calcium score is 1442 .  Between the 90th and 100th percentile for females between the ages of 86-69 this implies extensive Arthrosclerotic plaque , Risk of coronary artery disease high likelyhood of atleast significant coronary narrowing .

## 2020-06-03 ENCOUNTER — Other Ambulatory Visit: Payer: Self-pay

## 2020-06-03 MED ORDER — CLOBETASOL PROPIONATE 0.05 % EX OINT
TOPICAL_OINTMENT | CUTANEOUS | 0 refills | Status: DC
Start: 2020-06-03 — End: 2020-08-19

## 2020-06-03 NOTE — Telephone Encounter (Signed)
Medication refill request: Clobetasol  Last AEX:  06-05-2019 SM  Next AEX: 08-19-20 Last MMG (if hormonal medication request): n/a Refill authorized: Today, please advise.   Medication pended for #60g, 0RF. Please refill if appropriate.

## 2020-06-03 NOTE — Telephone Encounter (Signed)
Although this medication should generally  not be refilled with out assessment of the area, I understand that it is difficult since Dr. Sabra Heck left. I have reduced the size of the tube (for refill) and this should be plenty until her appointment. If the area is bothering her, is worsening or is bleeding, she would not wait until February for evaluation. Thank you.

## 2020-06-03 NOTE — Telephone Encounter (Signed)
Patient requesting refill on clobetasol. [facility] 336 [phone]

## 2020-06-16 ENCOUNTER — Other Ambulatory Visit: Payer: Self-pay

## 2020-06-16 DIAGNOSIS — R0609 Other forms of dyspnea: Secondary | ICD-10-CM

## 2020-06-16 DIAGNOSIS — Z8249 Family history of ischemic heart disease and other diseases of the circulatory system: Secondary | ICD-10-CM

## 2020-06-16 MED ORDER — LOSARTAN POTASSIUM 100 MG PO TABS: 100.0000 mg | ORAL_TABLET | Freq: Every evening | ORAL | 3 refills | Status: DC

## 2020-06-16 MED ORDER — LOSARTAN POTASSIUM 100 MG PO TABS: 100.0000 mg | ORAL_TABLET | Freq: Every evening | ORAL | 3 refills | Status: AC

## 2020-07-13 DIAGNOSIS — H52223 Regular astigmatism, bilateral: Secondary | ICD-10-CM | POA: Diagnosis not present

## 2020-07-13 DIAGNOSIS — H5212 Myopia, left eye: Secondary | ICD-10-CM | POA: Diagnosis not present

## 2020-07-13 DIAGNOSIS — H16141 Punctate keratitis, right eye: Secondary | ICD-10-CM | POA: Diagnosis not present

## 2020-07-14 ENCOUNTER — Ambulatory Visit: Payer: PPO | Admitting: Cardiology

## 2020-07-14 ENCOUNTER — Encounter: Payer: Self-pay | Admitting: Cardiology

## 2020-07-14 ENCOUNTER — Other Ambulatory Visit: Payer: Self-pay

## 2020-07-14 VITALS — BP 135/85 | HR 72 | Temp 98.2°F | Resp 16 | Ht 64.0 in | Wt 275.0 lb

## 2020-07-14 DIAGNOSIS — I10 Essential (primary) hypertension: Secondary | ICD-10-CM | POA: Diagnosis not present

## 2020-07-14 DIAGNOSIS — Z8249 Family history of ischemic heart disease and other diseases of the circulatory system: Secondary | ICD-10-CM | POA: Diagnosis not present

## 2020-07-14 DIAGNOSIS — E7800 Pure hypercholesterolemia, unspecified: Secondary | ICD-10-CM | POA: Diagnosis not present

## 2020-07-14 DIAGNOSIS — I25118 Atherosclerotic heart disease of native coronary artery with other forms of angina pectoris: Secondary | ICD-10-CM

## 2020-07-14 DIAGNOSIS — R931 Abnormal findings on diagnostic imaging of heart and coronary circulation: Secondary | ICD-10-CM

## 2020-07-14 DIAGNOSIS — Z6841 Body Mass Index (BMI) 40.0 and over, adult: Secondary | ICD-10-CM | POA: Diagnosis not present

## 2020-07-14 DIAGNOSIS — R0609 Other forms of dyspnea: Secondary | ICD-10-CM

## 2020-07-14 DIAGNOSIS — R0600 Dyspnea, unspecified: Secondary | ICD-10-CM

## 2020-07-14 MED ORDER — METOPROLOL TARTRATE 25 MG PO TABS: 25.0000 mg | ORAL_TABLET | Freq: Two times a day (BID) | ORAL | 2 refills | Status: DC

## 2020-07-14 MED ORDER — ASPIRIN 81 MG PO CHEW: 81.0000 mg | CHEWABLE_TABLET | Freq: Every day | ORAL | 2 refills | Status: AC

## 2020-07-14 NOTE — Progress Notes (Signed)
Primary Physician/Referring:  Derinda Late, MD  Patient ID: Tracy Cabrera, female    DOB: [birth year], 65 y.o.   MRN: [id number]  Chief Complaint  Patient presents with   Follow-up   HPI:    Tracy Cabrera  is a 65 y.o. Caucasian female with a strong family history of premature coronary disease in father at age 42 had CABG, her brother recently had stenting in his age 53 to 33 years, family history of "cardiomyopathy" in her sister who died at the age of 46 years in 1995, details not available, no other family members with sudden cardiac death or recurrent syncope,  Hypertension, prediabetes and hyperlipidemia. She has had sensorineural hearing loss diagnosed July 2021, mild obstructive sleep apnea on CPAP in 2018,  COVID-19 infection in December 2020, she is now fully vaccinated.  Patient states that over the past few years she has noticed gradually worsening dyspnea on exertion especially in the last 5 to 6 months.  She also states that her activity level has reduced as she had adopted two children (children of her nephew).  She is still working full-time as an Optometrist.  She denies chest pain, leg edema except at the prior injury site in the left ankle, palpitations, dizziness or syncope.    Past Medical History:  Diagnosis Date   Asthma    Blood transfusion without reported diagnosis 1973   Fracture of ankle, left, closed 05/2015   History of colon polyps 01/2008   Dr. Idolina Primer due 2015   Hyperlipidemia    Hypertension    Prediabetes    Restless leg syndrome    Sleep apnea    Mild   Past Surgical History:  Procedure Laterality Date   ANKLE FRACTURE SURGERY Left 05/2015   BREAST SURGERY  09/2009   benign bx--fibrocystic changes   L5/S1 discectomy  1973   Left eye surgery for a retinal tear     Family History  Problem Relation Age of Onset   Cardiomyopathy Sister 25       died of complications of cardiomyopathy   Hypertension Sister    Coronary  artery disease Father 75       CABG   Hypertension Father    Colon cancer Paternal Grandmother    Diabetes Brother    Hypertension Brother    Hypertension Mother     Social History   Tobacco Use   Smoking status: Never Smoker   Smokeless tobacco: Never Used   Tobacco comment: lives with a heavy smoker  Substance Use Topics   Alcohol use: Yes    Alcohol/week: 1.0 - 2.0 standard drink    Types: 1 - 2 Glasses of wine per week    Comment: 1-2 per month   Marital Status: Married  ROS  Review of Systems  Cardiovascular: Positive for dyspnea on exertion and leg swelling (left ankle). Negative for chest pain.  Gastrointestinal: Negative for melena.   Objective  Blood pressure 135/85, pulse 72, temperature 98.2 F (36.8 C), temperature source Temporal, resp. rate 16, height 5' 4"  (1.626 m), weight 275 lb (124.7 kg), last menstrual period 06/25/2000, SpO2 95 %.  Vitals with BMI 07/14/2020 05/16/2020 05/16/2020  Height 5' 4"  - 5' 4"   Weight 275 lbs - 266 lbs 6 oz  BMI 44.81 - 85.6  Systolic 314 970 263  Diastolic 85 80 86  Pulse 72 - 71     Physical Exam Constitutional:      Appearance: She is obese.  Cardiovascular:  Rate and Rhythm: Normal rate and regular rhythm.     Pulses: Intact distal pulses.     Heart sounds: Normal heart sounds. No murmur heard. No gallop.      Comments: No leg edema, no JVD. Pulmonary:     Effort: Pulmonary effort is normal.     Breath sounds: Normal breath sounds.  Abdominal:     General: Bowel sounds are normal.     Palpations: Abdomen is soft.    Laboratory examination:   Recent Labs    05/24/20 1445  NA 141  K 3.9  CL 101  CO2 26  GLUCOSE 109*  BUN 16  CREATININE 0.78  CALCIUM 9.2  GFRNONAA 79  GFRAA 92   CrCl cannot be calculated (Patient's most recent lab result is older than the maximum 21 days allowed.).  CMP Latest Ref Rng & Units 05/24/2020  Glucose 65 - 99 mg/dL 109(H)  BUN 8 - 27 mg/dL 16  Creatinine 0.57  - 1.00 mg/dL 0.78  Sodium 134 - 144 mmol/L 141  Potassium 3.5 - 5.2 mmol/L 3.9  Chloride 96 - 106 mmol/L 101  CO2 20 - 29 mmol/L 26  Calcium 8.7 - 10.3 mg/dL 9.2   No flowsheet data found.  Lipid Panel No results for input(s): CHOL, TRIG, LDLCALC, VLDL, HDL, CHOLHDL, LDLDIRECT in the last 8760 hours.  HEMOGLOBIN A1C No results found for: HGBA1C, MPG TSH No results for input(s): TSH in the last 8760 hours.  External labs:   Labs 07/13/2019:  Total cholesterol 116, triglycerides 109, HDL 50, LDL 47.  Normal particle number and size.  High sensitive CRP >10.0.  Vitamin D 44.2.  A1c 6.4%.  BUN 17, creatinine 0.92, EGFR 65 mL, potassium 3.9, sodium 139, CMP normal.  Hb 13.8/HCT 41.6, platelets 233.  Medications and allergies   Allergies  Allergen Reactions   Shrimp [Shellfish Allergy] Hives and Swelling   Vicodin [Hydrocodone-Acetaminophen]     Nausea      Outpatient Medications Prior to Visit  Medication Sig Dispense Refill   albuterol (PROVENTIL HFA;VENTOLIN HFA) 108 (90 BASE) MCG/ACT inhaler Inhale 2 puffs into the lungs every 6 (six) hours as needed.     calcium-vitamin D (OSCAL WITH D) 500-200 MG-UNIT per tablet Take 2 tablets by mouth daily.      chlorthalidone (HYGROTON) 25 MG tablet Take 25 mg by mouth daily.      Cholecalciferol (D3-1000 PO) Take 2 capsules by mouth daily.     clobetasol ointment (TEMOVATE) 0.05 % Apple topically to affected area BID for no more than 7 days. 30 g 0   ezetimibe (ZETIA) 10 MG tablet Take 1 tablet (10 mg total) by mouth daily. 30 tablet 0   fexofenadine (ALLEGRA) 180 MG tablet Take 180 mg by mouth daily as needed for allergies.      fish oil-omega-3 fatty acids 1000 MG capsule Take 2,000 mg by mouth daily.      FLOVENT HFA 110 MCG/ACT inhaler Inhale 2 puffs into the lungs 2 (two) times daily.     fluticasone (FLONASE) 50 MCG/ACT nasal spray Place 2 sprays into both nostrils daily as needed for allergies.       ibuprofen (ADVIL,MOTRIN) 200 MG tablet Take 200 mg by mouth every 6 (six) hours as needed.     losartan (COZAAR) 100 MG tablet Take 1 tablet (100 mg total) by mouth every evening. 90 tablet 3   naproxen sodium (ANAPROX) 220 MG tablet Take 220 mg by mouth daily as needed (  arthritis).      rosuvastatin (CRESTOR) 40 MG tablet Take 1 tablet by mouth daily.     ALPRAZolam (XANAX) 0.5 MG tablet Take 0.5 mg by mouth 2 (two) times daily as needed for sleep.  (Patient not taking: Reported on 07/14/2020)     famciclovir (FAMVIR) 250 MG tablet Take 250 mg by mouth daily as needed (fever blisters).  (Patient not taking: Reported on 07/14/2020)     Zinc 50 MG TABS  (Patient not taking: Reported on 07/14/2020)     Bacillus Coagulans-Inulin (ALIGN PREBIOTIC-PROBIOTIC PO)      No facility-administered medications prior to visit.   Radiology:   No results found.  Cardiac Studies:   ABI 07/13/2019 done at PCP office: Bilateral ABI 1.08.  Echocardiogram 05/24/2020: Left ventricle cavity is normal in size. Mild concentric hypertrophy of the left ventricle. Normal global wall motion. Normal LV systolic function with EF 55%. Doppler evidence of grade I (impaired) diastolic dysfunction, normal LAP.  No significant valvular abnormality. Normal right atrial pressure.   Exercise Myoview stress test 05/30/2020: Exercise nuclear stress test was performed using Bruce protocol. Patient reached 6.3 METS, and 123% of age predicted maximum heart rate. Exercise capacity was low. Chest pain not reported. Exertional dyspnea reported. Heart rate and hemodynamic response were normal.  Stress EKG showed sinus tachycardia, low voltage,  2 mm horizontal/downsloping ST depressions in leads V4-V6, II, II, aVF, normalizing within 1 min into revoery.  SPECT images showed decreased tracer uptake in inferior/infeoseptal myocardium, worse in rest images. While tissue attenuation is likely, ischemia in this region cannot be excluded.  Stress LVEF 71%. Intermediate risk study.  Compared to 03/06/2016 report of Lexiscan stress test, no ST-T wave changes and normal perfusion.  Coronary calcium score 06/02/2020: Total calcium score 1442. Left main: 38 LAD 328 CX 142 RCA 933. Total calcium score of 1442 is between 90th and 100 percentile for females age between 67-69 years.  Implies extensive atherosclerotic disease and risk of CAD and high likelihood of at least one significant coronary narrowing.   EKG:   EKG 05/16/2020: Normal sinus rhythm at rate of 64 bpm, normal axis.  No evidence of ischemia, normal EKG.     Assessment     ICD-10-CM   1. Agatston CAC score, >400: 06/02/2020: Total calcium score 1442  R93.1   2. Dyspnea on exertion  R06.00   3. Hypercholesteremia  E78.00   4. Primary hypertension  I10   5. Family history of premature CAD  Z82.49   6. Coronary artery disease of native artery of native heart with stable angina pectoris (HCC)  I25.118 metoprolol tartrate (LOPRESSOR) 25 MG tablet    aspirin (ASPIRIN CHILDRENS) 81 MG chewable tablet    Basic metabolic panel    CBC  7. Family history of premature CAD  Z14.49    Cardiomyopathy (age of onset: 70) in her sister died in 32; Coronary artery disease in father (age of onset 44Y)  73. Class 3 severe obesity due to excess calories with serious comorbidity and body mass index (BMI) of 45.0 to 49.9 in adult [facility])  E66.01    Z68.42     Medications Discontinued During This Encounter  Medication Reason   Bacillus Coagulans-Inulin (ALIGN PREBIOTIC-PROBIOTIC PO) Error    Meds ordered this encounter  Medications   metoprolol tartrate (LOPRESSOR) 25 MG tablet    Sig: Take 1 tablet (25 mg total) by mouth 2 (two) times daily.    Dispense:  60 tablet  Refill:  2   aspirin (ASPIRIN CHILDRENS) 81 MG chewable tablet    Sig: Chew 1 tablet (81 mg total) by mouth daily.    Dispense:  180 tablet    Refill:  2   Orders Placed This Encounter  Procedures    Basic metabolic panel   CBC    Recommendations:   TAZARIA DLUGOSZ is a 65 y.o. Caucasian female with a strong family history of premature coronary disease in father at age 11 had CABG, her brother recently had stenting in his age 40 to 68 years, family history of "cardiomyopathy" in her sister who died at the age of 33 years in 1995, details not available, no other family members with sudden cardiac death or recurrent syncope,  Hypertension, pre diabetes and hyperlipidemia. She has had sensorineural hearing loss diagnosed July 2021, mild obstructive sleep apnea on CPAP in 2018,  COVID-19 infection in December 2020.   Her dyspnea may be anginal equivalent especially in view of very strong family history of premature coronary disease.  Her activity is also significantly reduced due to obesity and also degenerative joint disease involving the knee.  She has already been on statin therapy and blood pressure is well controlled.  Although symptoms could be chronic stable angina, in view of extremely strong family history of sudden death due to myocardial infarction, recent change in her symptoms of dyspnea which is now class III, I have recommended metoprolol 25 mg p.o. twice daily and after discussion with the patient she is extremely anxious and also worried about mortality.  I will schedule her for cardiac catheterization possible PCI.  I have discussed with her that in case they do note stable disease or small vessel disease we should still opt for medical therapy only and proceed with intervention if there is proximal vessel disease or major vessel disease or left main disease.  Patient understands this.  Schedule for cardiac catheterization, and possible angioplasty. We discussed regarding risks, benefits, alternatives to this including stress testing, CTA and continued medical therapy. Patient wants to proceed. Understands <1-2% risk of death, stroke, MI, urgent CABG, bleeding, infection, renal failure  but not limited to these.  I spent 40 minutes with discussions regarding intermediate restudy which reveals possible inferior perfusion abnormality and also coronary calcium score is the highest and right coronary artery.  Extremely high risk individual.  Morbid obesity was discussed with the patient.  She now appears extremely motivated for weight loss.  I will continue to reinforce this.    Tracy Prows, MD, [facility] 07/15/2020, 6:47 AM Office: [phone]

## 2020-07-14 NOTE — H&P (View-Only) (Signed)
Primary Physician/Referring:  Derinda Late, MD  Patient ID: Tracy Cabrera, female    DOB: 23-May-1956, 65 y.o.   MRN: [id number]  Chief Complaint  Patient presents with  . Follow-up   HPI:    Tracy Cabrera  is a 65 y.o. Caucasian female with a strong family history of premature coronary disease in father at age 22 had CABG, her brother recently had stenting in his age 52 to 79 years, family history of "cardiomyopathy" in her sister who died at the age of 2 years in 1995, details not available, no other family members with sudden cardiac death or recurrent syncope,  Hypertension, prediabetes and hyperlipidemia. She has had sensorineural hearing loss diagnosed July 2021, mild obstructive sleep apnea on CPAP in 2018,  COVID-19 infection in December 2020, she is now fully vaccinated.  Patient states that over the past few years she has noticed gradually worsening dyspnea on exertion especially in the last 5 to 6 months.  She also states that her activity level has reduced as she had adopted two children (children of her nephew).  She is still working full-time as an Optometrist.  She denies chest pain, leg edema except at the prior injury site in the left ankle, palpitations, dizziness or syncope.    Past Medical History:  Diagnosis Date  . Asthma   . Blood transfusion without reported diagnosis 1973  . Fracture of ankle, left, closed 05/2015  . History of colon polyps 01/2008   Dr. Idolina Primer due 2015  . Hyperlipidemia   . Hypertension   . Prediabetes   . Restless leg syndrome   . Sleep apnea    Mild   Past Surgical History:  Procedure Laterality Date  . ANKLE FRACTURE SURGERY Left 05/2015  . BREAST SURGERY  09/2009   benign bx--fibrocystic changes  . L5/S1 discectomy  1973  . Left eye surgery for a retinal tear     Family History  Problem Relation Age of Onset  . Cardiomyopathy Sister 44       died of complications of cardiomyopathy  . Hypertension Sister   . Coronary  artery disease Father 26       CABG  . Hypertension Father   . Colon cancer Paternal Grandmother   . Diabetes Brother   . Hypertension Brother   . Hypertension Mother     Social History   Tobacco Use  . Smoking status: Never Smoker  . Smokeless tobacco: Never Used  . Tobacco comment: lives with a heavy smoker  Substance Use Topics  . Alcohol use: Yes    Alcohol/week: 1.0 - 2.0 standard drink    Types: 1 - 2 Glasses of wine per week    Comment: 1-2 per month   Marital Status: Married  ROS  Review of Systems  Cardiovascular: Positive for dyspnea on exertion and leg swelling (left ankle). Negative for chest pain.  Gastrointestinal: Negative for melena.   Objective  Blood pressure 135/85, pulse 72, temperature 98.2 F (36.8 C), temperature source Temporal, resp. rate 16, height 5' 4"  (1.626 m), weight 275 lb (124.7 kg), last menstrual period 06/25/2000, SpO2 95 %.  Vitals with BMI 07/14/2020 05/16/2020 05/16/2020  Height 5' 4"  - 5' 4"   Weight 275 lbs - 266 lbs 6 oz  BMI 46.65 - 99.3  Systolic 570 177 939  Diastolic 85 80 86  Pulse 72 - 71     Physical Exam Constitutional:      Appearance: She is obese.  Cardiovascular:  Rate and Rhythm: Normal rate and regular rhythm.     Pulses: Intact distal pulses.     Heart sounds: Normal heart sounds. No murmur heard. No gallop.      Comments: No leg edema, no JVD. Pulmonary:     Effort: Pulmonary effort is normal.     Breath sounds: Normal breath sounds.  Abdominal:     General: Bowel sounds are normal.     Palpations: Abdomen is soft.    Laboratory examination:   Recent Labs    05/24/20 1445  NA 141  K 3.9  CL 101  CO2 26  GLUCOSE 109*  BUN 16  CREATININE 0.78  CALCIUM 9.2  GFRNONAA 79  GFRAA 92   CrCl cannot be calculated (Patient's most recent lab result is older than the maximum 21 days allowed.).  CMP Latest Ref Rng & Units 05/24/2020  Glucose 65 - 99 mg/dL 109(H)  BUN 8 - 27 mg/dL 16  Creatinine 0.57  - 1.00 mg/dL 0.78  Sodium 134 - 144 mmol/L 141  Potassium 3.5 - 5.2 mmol/L 3.9  Chloride 96 - 106 mmol/L 101  CO2 20 - 29 mmol/L 26  Calcium 8.7 - 10.3 mg/dL 9.2   No flowsheet data found.  Lipid Panel No results for input(s): CHOL, TRIG, LDLCALC, VLDL, HDL, CHOLHDL, LDLDIRECT in the last 8760 hours.  HEMOGLOBIN A1C No results found for: HGBA1C, MPG TSH No results for input(s): TSH in the last 8760 hours.  External labs:   Labs 07/13/2019:  Total cholesterol 116, triglycerides 109, HDL 50, LDL 47.  Normal particle number and size.  High sensitive CRP >10.0.  Vitamin D 44.2.  A1c 6.4%.  BUN 17, creatinine 0.92, EGFR 65 mL, potassium 3.9, sodium 139, CMP normal.  Hb 13.8/HCT 41.6, platelets 233.  Medications and allergies   Allergies  Allergen Reactions  . Shrimp [Shellfish Allergy] Hives and Swelling  . Vicodin [Hydrocodone-Acetaminophen]     Nausea      Outpatient Medications Prior to Visit  Medication Sig Dispense Refill  . albuterol (PROVENTIL HFA;VENTOLIN HFA) 108 (90 BASE) MCG/ACT inhaler Inhale 2 puffs into the lungs every 6 (six) hours as needed.    . calcium-vitamin D (OSCAL WITH D) 500-200 MG-UNIT per tablet Take 2 tablets by mouth daily.     . chlorthalidone (HYGROTON) 25 MG tablet Take 25 mg by mouth daily.     . Cholecalciferol (D3-1000 PO) Take 2 capsules by mouth daily.    . clobetasol ointment (TEMOVATE) 0.05 % Apple topically to affected area BID for no more than 7 days. 30 g 0  . ezetimibe (ZETIA) 10 MG tablet Take 1 tablet (10 mg total) by mouth daily. 30 tablet 0  . fexofenadine (ALLEGRA) 180 MG tablet Take 180 mg by mouth daily as needed for allergies.     . fish oil-omega-3 fatty acids 1000 MG capsule Take 2,000 mg by mouth daily.     Marland Kitchen FLOVENT HFA 110 MCG/ACT inhaler Inhale 2 puffs into the lungs 2 (two) times daily.    . fluticasone (FLONASE) 50 MCG/ACT nasal spray Place 2 sprays into both nostrils daily as needed for allergies.     Marland Kitchen  ibuprofen (ADVIL,MOTRIN) 200 MG tablet Take 200 mg by mouth every 6 (six) hours as needed.    Marland Kitchen losartan (COZAAR) 100 MG tablet Take 1 tablet (100 mg total) by mouth every evening. 90 tablet 3  . naproxen sodium (ANAPROX) 220 MG tablet Take 220 mg by mouth daily as needed (  arthritis).     . rosuvastatin (CRESTOR) 40 MG tablet Take 1 tablet by mouth daily.    Marland Kitchen ALPRAZolam (XANAX) 0.5 MG tablet Take 0.5 mg by mouth 2 (two) times daily as needed for sleep.  (Patient not taking: Reported on 07/14/2020)    . famciclovir (FAMVIR) 250 MG tablet Take 250 mg by mouth daily as needed (fever blisters).  (Patient not taking: Reported on 07/14/2020)    . Zinc 50 MG TABS  (Patient not taking: Reported on 07/14/2020)    . Bacillus Coagulans-Inulin (ALIGN PREBIOTIC-PROBIOTIC PO)      No facility-administered medications prior to visit.   Radiology:   No results found.  Cardiac Studies:   ABI 07/13/2019 done at PCP office: Bilateral ABI 1.08.  Echocardiogram 05/24/2020: Left ventricle cavity is normal in size. Mild concentric hypertrophy of the left ventricle. Normal global wall motion. Normal LV systolic function with EF 55%. Doppler evidence of grade I (impaired) diastolic dysfunction, normal LAP.  No significant valvular abnormality. Normal right atrial pressure.   Exercise Myoview stress test 05/30/2020: Exercise nuclear stress test was performed using Bruce protocol. Patient reached 6.3 METS, and 123% of age predicted maximum heart rate. Exercise capacity was low. Chest pain not reported. Exertional dyspnea reported. Heart rate and hemodynamic response were normal.  Stress EKG showed sinus tachycardia, low voltage,  2 mm horizontal/downsloping ST depressions in leads V4-V6, II, II, aVF, normalizing within 1 min into revoery.  SPECT images showed decreased tracer uptake in inferior/infeoseptal myocardium, worse in rest images. While tissue attenuation is likely, ischemia in this region cannot be excluded.  Stress LVEF 71%. Intermediate risk study.  Compared to 03/06/2016 report of Lexiscan stress test, no ST-T wave changes and normal perfusion.  Coronary calcium score 06/02/2020: Total calcium score 1442. Left main: 38 LAD 328 CX 142 RCA 933. Total calcium score of 1442 is between 90th and 100 percentile for females age between 71-69 years.  Implies extensive atherosclerotic disease and risk of CAD and high likelihood of at least one significant coronary narrowing.   EKG:   EKG 05/16/2020: Normal sinus rhythm at rate of 64 bpm, normal axis.  No evidence of ischemia, normal EKG.     Assessment     ICD-10-CM   1. Agatston CAC score, >400: 06/02/2020: Total calcium score 1442  R93.1   2. Dyspnea on exertion  R06.00   3. Hypercholesteremia  E78.00   4. Primary hypertension  I10   5. Family history of premature CAD  Z82.49   6. Coronary artery disease of native artery of native heart with stable angina pectoris (HCC)  I25.118 metoprolol tartrate (LOPRESSOR) 25 MG tablet    aspirin (ASPIRIN CHILDRENS) 81 MG chewable tablet    Basic metabolic panel    CBC  7. Family history of premature CAD  Z66.49    Cardiomyopathy (age of onset: 31) in her sister died in 58; Coronary artery disease in father (age of onset 62Y)  20. Class 3 severe obesity due to excess calories with serious comorbidity and body mass index (BMI) of 45.0 to 49.9 in adult [facility])  E66.01    Z68.42     Medications Discontinued During This Encounter  Medication Reason  . Bacillus Coagulans-Inulin (ALIGN PREBIOTIC-PROBIOTIC PO) Error    Meds ordered this encounter  Medications  . metoprolol tartrate (LOPRESSOR) 25 MG tablet    Sig: Take 1 tablet (25 mg total) by mouth 2 (two) times daily.    Dispense:  60 tablet  Refill:  2  . aspirin (ASPIRIN CHILDRENS) 81 MG chewable tablet    Sig: Chew 1 tablet (81 mg total) by mouth daily.    Dispense:  180 tablet    Refill:  2   Orders Placed This Encounter  Procedures  .  Basic metabolic panel  . CBC    Recommendations:   Tracy Cabrera is a 65 y.o. Caucasian female with a strong family history of premature coronary disease in father at age 78 had CABG, her brother recently had stenting in his age 54 to 77 years, family history of "cardiomyopathy" in her sister who died at the age of 41 years in 1995, details not available, no other family members with sudden cardiac death or recurrent syncope,  Hypertension, pre diabetes and hyperlipidemia. She has had sensorineural hearing loss diagnosed July 2021, mild obstructive sleep apnea on CPAP in 2018,  COVID-19 infection in December 2020.   Her dyspnea may be anginal equivalent especially in view of very strong family history of premature coronary disease.  Her activity is also significantly reduced due to obesity and also degenerative joint disease involving the knee.  She has already been on statin therapy and blood pressure is well controlled.  Although symptoms could be chronic stable angina, in view of extremely strong family history of sudden death due to myocardial infarction, recent change in her symptoms of dyspnea which is now class III, I have recommended metoprolol 25 mg p.o. twice daily and after discussion with the patient she is extremely anxious and also worried about mortality.  I will schedule her for cardiac catheterization possible PCI.  I have discussed with her that in case they do note stable disease or small vessel disease we should still opt for medical therapy only and proceed with intervention if there is proximal vessel disease or major vessel disease or left main disease.  Patient understands this.  Schedule for cardiac catheterization, and possible angioplasty. We discussed regarding risks, benefits, alternatives to this including stress testing, CTA and continued medical therapy. Patient wants to proceed. Understands <1-2% risk of death, stroke, MI, urgent CABG, bleeding, infection, renal failure  but not limited to these.  I spent 40 minutes with discussions regarding intermediate restudy which reveals possible inferior perfusion abnormality and also coronary calcium score is the highest and right coronary artery.  Extremely high risk individual.  Morbid obesity was discussed with the patient.  She now appears extremely motivated for weight loss.  I will continue to reinforce this.    Adrian Prows, MD, [facility] 07/15/2020, 6:47 AM Office: [phone]

## 2020-07-19 DIAGNOSIS — Z1231 Encounter for screening mammogram for malignant neoplasm of breast: Secondary | ICD-10-CM | POA: Diagnosis not present

## 2020-07-20 DIAGNOSIS — I25118 Atherosclerotic heart disease of native coronary artery with other forms of angina pectoris: Secondary | ICD-10-CM | POA: Diagnosis not present

## 2020-07-21 LAB — CBC
Hematocrit: 41.5 % (ref 34.0–46.6)
Hemoglobin: 13.5 g/dL (ref 11.1–15.9)
MCH: 28.4 pg (ref 26.6–33.0)
MCHC: 32.5 g/dL (ref 31.5–35.7)
MCV: 87 fL (ref 79–97)
Platelets: 252 10*3/uL (ref 150–450)
RBC: 4.75 x10E6/uL (ref 3.77–5.28)
RDW: 13.4 % (ref 11.7–15.4)
WBC: 9.4 10*3/uL (ref 3.4–10.8)

## 2020-07-21 LAB — BASIC METABOLIC PANEL
BUN/Creatinine Ratio: 21 (ref 12–28)
BUN: 17 mg/dL (ref 8–27)
CO2: 27 mmol/L (ref 20–29)
Calcium: 9.1 mg/dL (ref 8.7–10.3)
Chloride: 101 mmol/L (ref 96–106)
Creatinine, Ser: 0.81 mg/dL (ref 0.57–1.00)
GFR calc Af Amer: 88 mL/min/{1.73_m2} (ref >59)
GFR calc non Af Amer: 76 mL/min/{1.73_m2} (ref >59)
Glucose: 105 mg/dL — ABNORMAL HIGH (ref 65–99)
Potassium: 3.5 mmol/L (ref 3.5–5.2)
Sodium: 140 mmol/L (ref 134–144)

## 2020-07-23 ENCOUNTER — Other Ambulatory Visit (HOSPITAL_COMMUNITY)
Admission: RE | Admit: 2020-07-23 | Discharge: 2020-07-23 | Disposition: A | Discharge status: Home / Self Care | Payer: PPO | Source: Ambulatory Visit | Attending: Cardiology | Admitting: Cardiology

## 2020-07-23 DIAGNOSIS — Z01812 Encounter for preprocedural laboratory examination: Secondary | ICD-10-CM | POA: Insufficient documentation

## 2020-07-23 DIAGNOSIS — Z20822 Contact with and (suspected) exposure to covid-19: Secondary | ICD-10-CM | POA: Diagnosis not present

## 2020-07-23 LAB — SARS CORONAVIRUS 2 (TAT 6-24 HRS): SARS Coronavirus 2: NEGATIVE

## 2020-07-25 DIAGNOSIS — I2510 Atherosclerotic heart disease of native coronary artery without angina pectoris: Secondary | ICD-10-CM | POA: Diagnosis present

## 2020-07-26 ENCOUNTER — Encounter (HOSPITAL_COMMUNITY)
Admission: RE | Disposition: A | Discharge status: Home / Self Care | Payer: Self-pay | Source: Home / Self Care | Attending: Cardiology

## 2020-07-26 ENCOUNTER — Ambulatory Visit (HOSPITAL_COMMUNITY)
Admission: RE | Admit: 2020-07-26 | Discharge: 2020-07-26 | Disposition: A | Discharge status: Home / Self Care | Payer: PPO | Attending: Cardiology | Admitting: Cardiology

## 2020-07-26 ENCOUNTER — Other Ambulatory Visit: Payer: Self-pay

## 2020-07-26 DIAGNOSIS — Z91013 Allergy to seafood: Secondary | ICD-10-CM | POA: Diagnosis not present

## 2020-07-26 DIAGNOSIS — Z6841 Body Mass Index (BMI) 40.0 and over, adult: Secondary | ICD-10-CM | POA: Insufficient documentation

## 2020-07-26 DIAGNOSIS — Z885 Allergy status to narcotic agent status: Secondary | ICD-10-CM | POA: Insufficient documentation

## 2020-07-26 DIAGNOSIS — R0600 Dyspnea, unspecified: Secondary | ICD-10-CM | POA: Diagnosis not present

## 2020-07-26 DIAGNOSIS — E7800 Pure hypercholesterolemia, unspecified: Secondary | ICD-10-CM | POA: Insufficient documentation

## 2020-07-26 DIAGNOSIS — I25118 Atherosclerotic heart disease of native coronary artery with other forms of angina pectoris: Secondary | ICD-10-CM | POA: Insufficient documentation

## 2020-07-26 DIAGNOSIS — I10 Essential (primary) hypertension: Secondary | ICD-10-CM | POA: Insufficient documentation

## 2020-07-26 DIAGNOSIS — I2510 Atherosclerotic heart disease of native coronary artery without angina pectoris: Secondary | ICD-10-CM | POA: Diagnosis present

## 2020-07-26 DIAGNOSIS — Z79899 Other long term (current) drug therapy: Secondary | ICD-10-CM | POA: Insufficient documentation

## 2020-07-26 DIAGNOSIS — Z8249 Family history of ischemic heart disease and other diseases of the circulatory system: Secondary | ICD-10-CM | POA: Diagnosis not present

## 2020-07-26 DIAGNOSIS — I209 Angina pectoris, unspecified: Secondary | ICD-10-CM | POA: Diagnosis present

## 2020-07-26 HISTORY — PX: LEFT HEART CATH AND CORONARY ANGIOGRAPHY: CATH118249

## 2020-07-26 SURGERY — LEFT HEART CATH AND CORONARY ANGIOGRAPHY
Anesthesia: LOCAL

## 2020-07-26 MED ORDER — LIDOCAINE HCL (PF) 1 % IJ SOLN
INTRAMUSCULAR | Status: DC | PRN
  Administered 2020-07-26: 2 mL via INTRADERMAL

## 2020-07-26 MED ORDER — HEPARIN SODIUM (PORCINE) 1000 UNIT/ML IJ SOLN
INTRAMUSCULAR | Status: AC
  Filled 2020-07-26: qty 1

## 2020-07-26 MED ORDER — SODIUM CHLORIDE 0.9% FLUSH: 3.0000 mL | INTRAVENOUS | Status: DC | PRN

## 2020-07-26 MED ORDER — SODIUM CHLORIDE 0.9 % IV SOLN: 250.0000 mL | INTRAVENOUS | Status: DC | PRN

## 2020-07-26 MED ORDER — ASPIRIN 81 MG PO CHEW: 81.0000 mg | CHEWABLE_TABLET | ORAL | Status: DC

## 2020-07-26 MED ORDER — SODIUM CHLORIDE 0.9% FLUSH: 3.0000 mL | Freq: Two times a day (BID) | INTRAVENOUS | Status: DC

## 2020-07-26 MED ORDER — MIDAZOLAM HCL 2 MG/2ML IJ SOLN
INTRAMUSCULAR | Status: DC | PRN
  Administered 2020-07-26 (×2): 1 mg via INTRAVENOUS

## 2020-07-26 MED ORDER — HEPARIN (PORCINE) IN NACL 1000-0.9 UT/500ML-% IV SOLN
INTRAVENOUS | Status: DC | PRN
  Administered 2020-07-26 (×2): 500 mL

## 2020-07-26 MED ORDER — FENTANYL CITRATE (PF) 100 MCG/2ML IJ SOLN
INTRAMUSCULAR | Status: DC | PRN
  Administered 2020-07-26: 25 ug via INTRAVENOUS

## 2020-07-26 MED ORDER — HEPARIN (PORCINE) IN NACL 1000-0.9 UT/500ML-% IV SOLN
INTRAVENOUS | Status: AC
  Filled 2020-07-26: qty 1000

## 2020-07-26 MED ORDER — VERAPAMIL HCL 2.5 MG/ML IV SOLN
INTRA_ARTERIAL | Status: DC | PRN
  Administered 2020-07-26: 10 mL via INTRA_ARTERIAL

## 2020-07-26 MED ORDER — MIDAZOLAM HCL 2 MG/2ML IJ SOLN
INTRAMUSCULAR | Status: AC
  Filled 2020-07-26: qty 2

## 2020-07-26 MED ORDER — NITROGLYCERIN 1 MG/10 ML FOR IR/CATH LAB
INTRA_ARTERIAL | Status: AC
  Filled 2020-07-26: qty 10

## 2020-07-26 MED ORDER — ONDANSETRON HCL 4 MG/2ML IJ SOLN: 4.0000 mg | Freq: Four times a day (QID) | INTRAMUSCULAR | Status: DC | PRN

## 2020-07-26 MED ORDER — LIDOCAINE HCL (PF) 1 % IJ SOLN
INTRAMUSCULAR | Status: AC
  Filled 2020-07-26: qty 30

## 2020-07-26 MED ORDER — FENTANYL CITRATE (PF) 100 MCG/2ML IJ SOLN
INTRAMUSCULAR | Status: AC
  Filled 2020-07-26: qty 2

## 2020-07-26 MED ORDER — SODIUM CHLORIDE 0.9 % WEIGHT BASED INFUSION
3.0000 mL/kg/h | INTRAVENOUS | Status: AC
  Administered 2020-07-26: 3 mL/kg/h via INTRAVENOUS

## 2020-07-26 MED ORDER — SODIUM CHLORIDE 0.9 % WEIGHT BASED INFUSION: 1.0000 mL/kg/h | INTRAVENOUS | Status: DC

## 2020-07-26 MED ORDER — HEPARIN SODIUM (PORCINE) 1000 UNIT/ML IJ SOLN
INTRAMUSCULAR | Status: DC | PRN
  Administered 2020-07-26: 6000 [IU] via INTRAVENOUS

## 2020-07-26 MED ORDER — IOHEXOL 350 MG/ML SOLN
INTRAVENOUS | Status: DC | PRN
  Administered 2020-07-26: 90 mL

## 2020-07-26 MED ORDER — NITROGLYCERIN 1 MG/10 ML FOR IR/CATH LAB
INTRA_ARTERIAL | Status: DC | PRN
  Administered 2020-07-26: 200 ug via INTRACORONARY

## 2020-07-26 MED ORDER — VERAPAMIL HCL 2.5 MG/ML IV SOLN
INTRAVENOUS | Status: AC
  Filled 2020-07-26: qty 2

## 2020-07-26 SURGICAL SUPPLY — 10 items
CATH INFINITI 5 FR JL3.5 (CATHETERS) ×2 IMPLANT
CATH OPTITORQUE TIG 4.0 5F (CATHETERS) ×2 IMPLANT
DEVICE RAD COMP TR BAND LRG (VASCULAR PRODUCTS) ×2 IMPLANT
GLIDESHEATH SLEND A-KIT 6F 22G (SHEATH) ×2 IMPLANT
GUIDEWIRE INQWIRE 1.5J.035X260 (WIRE) ×1 IMPLANT
INQWIRE 1.5J .035X260CM (WIRE) ×2
KIT HEART LEFT (KITS) ×2 IMPLANT
PACK CARDIAC CATHETERIZATION (CUSTOM PROCEDURE TRAY) ×2 IMPLANT
TRANSDUCER W/STOPCOCK (MISCELLANEOUS) ×2 IMPLANT
TUBING CIL FLEX 10 FLL-RA (TUBING) ×2 IMPLANT

## 2020-07-26 NOTE — Progress Notes (Signed)
Patient was given discharge instructions. She verbalized understanding. 

## 2020-07-26 NOTE — CV Procedure (Signed)
Left Heart Catheterization 07/26/20:             Medical therapy for mid LAD trifurcation stenosis and if fails, then PCI.  Normal LVEF.   Adrian Prows, MD, [facility] 07/26/2020, 12:35 PM Office: [phone] Pager: [phone]

## 2020-07-26 NOTE — Interval H&P Note (Signed)
History and Physical Interval Note:  07/26/2020 11:28 AM  Tracy Cabrera  has presented today for surgery, with the diagnosis of CAD.  The various methods of treatment have been discussed with the patient and family. After consideration of risks, benefits and other options for treatment, the patient has consented to  Procedure(s): LEFT HEART CATH AND CORONARY ANGIOGRAPHY (N/A) and possible angioplasty as a surgical intervention.  The patient's history has been reviewed, patient examined, no change in status, stable for surgery.  I have reviewed the patient's chart and labs.  Questions were answered to the patient's satisfaction.   Although stress test was intermediate risk, clinically high risk stress test in view of recent change in symptoms.  Hence proceed with angiography and possible angioplasty.  Cath Lab Visit (complete for each Cath Lab visit)  Clinical Evaluation Leading to the Procedure:   ACS: No.  Non-ACS:    Anginal Classification: CCS III  Anti-ischemic medical therapy: Minimal Therapy (1 class of medications)  Non-Invasive Test Results: High-risk stress test findings: cardiac mortality >3%/year  Prior CABG: No previous CABG  Adrian Prows

## 2020-07-26 NOTE — Discharge Instructions (Signed)
Drink plenty of fluid for 48 hours and keep wrist elevated at heart level for 24 hours  Radial Site Care   This sheet gives you information about how to care for yourself after your procedure. Your health care provider may also give you more specific instructions. If you have problems or questions, contact your health care provider. What can I expect after the procedure? After the procedure, it is common to have:  Bruising and tenderness at the catheter insertion area. Follow these instructions at home: Medicines  Take over-the-counter and prescription medicines only as told by your health care provider. Insertion site care 1. Follow instructions from your health care provider about how to take care of your insertion site. Make sure you: ? Wash your hands with soap and water before you change your bandage (dressing). If soap and water are not available, use hand sanitizer. ? remove your dressing as told by your health care provider. In 24 hours 2. Check your insertion site every day for signs of infection. Check for: ? Redness, swelling, or pain. ? Fluid or blood. ? Pus or a bad smell. ? Warmth. 3. Do not take baths, swim, or use a hot tub until your health care provider approves. 4. You may shower 24-48 hours after the procedure, or as directed by your health care provider. ? Remove the dressing and gently wash the site with plain soap and water. ? Pat the area dry with a clean towel. ? Do not rub the site. That could cause bleeding. 5. Do not apply powder or lotion to the site. Activity   1. For 24 hours after the procedure, or as directed by your health care provider: ? Do not flex or bend the affected arm. ? Do not push or pull heavy objects with the affected arm. ? Do not drive yourself home from the hospital or clinic. You may drive 24 hours after the procedure unless your health care provider tells you not to. ? Do not operate machinery or power tools. 2. Do not lift  anything that is heavier than 10 lb (4.5 kg), or the limit that you are told, until your health care provider says that it is safe. For 4 days 3. Ask your health care provider when it is okay to: ? Return to work or school. ? Resume usual physical activities or sports. ? Resume sexual activity. General instructions  If the catheter site starts to bleed, raise your arm and put firm pressure on the site. If the bleeding does not stop, get help right away. This is a medical emergency.  If you went home on the same day as your procedure, a responsible adult should be with you for the first 24 hours after you arrive home.  Keep all follow-up visits as told by your health care provider. This is important. Contact a health care provider if:  You have a fever.  You have redness, swelling, or yellow drainage around your insertion site. Get help right away if:  You have unusual pain at the radial site.  The catheter insertion area swells very fast.  The insertion area is bleeding, and the bleeding does not stop when you hold steady pressure on the area.  Your arm or hand becomes pale, cool, tingly, or numb. These symptoms may represent a serious problem that is an emergency. Do not wait to see if the symptoms will go away. Get medical help right away. Call your local emergency services (911 in the [place] Do not   drive yourself to the hospital. Summary  After the procedure, it is common to have bruising and tenderness at the site.  Follow instructions from your health care provider about how to take care of your radial site wound. Check the wound every day for signs of infection.  Do not lift anything that is heavier than 10 lb (4.5 kg), or the limit that you are told, until your health care provider says that it is safe. This information is not intended to replace advice given to you by your health care provider. Make sure you discuss any questions you have with your health care  provider. Document Revised: 07/17/2017 Document Reviewed: 07/17/2017 Elsevier Patient Education  2020 [employer]  

## 2020-07-27 ENCOUNTER — Encounter (HOSPITAL_COMMUNITY): Payer: Self-pay | Admitting: Cardiology

## 2020-07-28 DIAGNOSIS — I351 Nonrheumatic aortic (valve) insufficiency: Secondary | ICD-10-CM | POA: Diagnosis not present

## 2020-07-28 DIAGNOSIS — I340 Nonrheumatic mitral (valve) insufficiency: Secondary | ICD-10-CM | POA: Diagnosis not present

## 2020-07-28 DIAGNOSIS — G4733 Obstructive sleep apnea (adult) (pediatric): Secondary | ICD-10-CM | POA: Diagnosis not present

## 2020-07-28 DIAGNOSIS — I110 Hypertensive heart disease with heart failure: Secondary | ICD-10-CM | POA: Diagnosis not present

## 2020-07-28 DIAGNOSIS — I2510 Atherosclerotic heart disease of native coronary artery without angina pectoris: Secondary | ICD-10-CM | POA: Diagnosis not present

## 2020-07-28 DIAGNOSIS — I5032 Chronic diastolic (congestive) heart failure: Secondary | ICD-10-CM | POA: Diagnosis not present

## 2020-07-28 DIAGNOSIS — Z0000 Encounter for general adult medical examination without abnormal findings: Secondary | ICD-10-CM | POA: Diagnosis not present

## 2020-07-28 DIAGNOSIS — R7302 Impaired glucose tolerance (oral): Secondary | ICD-10-CM | POA: Diagnosis not present

## 2020-07-28 DIAGNOSIS — Z23 Encounter for immunization: Secondary | ICD-10-CM | POA: Diagnosis not present

## 2020-08-09 NOTE — Progress Notes (Signed)
Primary Physician/Referring:  Derinda Late, MD  Patient ID: Tracy Cabrera, female    DOB: October 06, 1955, 65 y.o.   MRN: [id number]  Chief Complaint  Patient presents with  . Follow-up    Cath - No stent placement   HPI:    Tracy Cabrera  is a 65 y.o. Caucasian female with a strong family history of premature coronary disease in father at age 54 had CABG, her brother recently had stenting in his age 73 to 57 years, family history of "cardiomyopathy" in her sister who died at the age of 13 years in 1995, details not available, no other family members with sudden cardiac death or recurrent syncope. Patient has history of hypertension, prediabetes and hyperlipidemia. She has had sensorineural hearing loss diagnosed July 2021, mild obstructive sleep apnea on CPAP in 2018,  COVID-19 infection in December 2020, she is now fully vaccinated.  Patient underwent left heart catheterization on 07/26/2290 and now presents for follow-up.  In view of patient's strong family history of cardiovascular disease and her ongoing symptoms of dyspnea is recommended patient undergo cardiac catheterization with possible PCI.  Left heart catheterization revealed mid to distal LAD stenosis which is not high-grade, and medical therapy was recommended by Dr. Einar Gip.   Patient reports she is feeling well since cardiac catheterization.  She has been increasing her exercise and is now exercising daily for 20-30 minutes.  She has no chest pain with exertion.  She reports her dyspnea has improved since exercising more frequently and making diet and lifestyle modifications.  Patient has been following strict diet plan avoiding red meat and processed foods, as well as sodium.  Since last visit patient has lost approximately 11 pounds.  She does continue to have intermittent nonexertional chest pain several times throughout the day lasting less than 1 minute each.  She is presently tolerating guideline directed medical therapy  including aspirin, losartan, metoprolol, and rosuvastatin.  Past Medical History:  Diagnosis Date  . Asthma   . Blood transfusion without reported diagnosis 1973  . Fracture of ankle, left, closed 05/2015  . History of colon polyps 01/2008   Dr. Idolina Primer due 2015  . Hyperlipidemia   . Hypertension   . Prediabetes   . Restless leg syndrome   . Sleep apnea    Mild   Past Surgical History:  Procedure Laterality Date  . ANKLE FRACTURE SURGERY Left 05/2015  . BREAST SURGERY  09/2009   benign bx--fibrocystic changes  . L5/S1 discectomy  1973  . Left eye surgery for a retinal tear    . LEFT HEART CATH AND CORONARY ANGIOGRAPHY N/A 07/26/2020   Procedure: LEFT HEART CATH AND CORONARY ANGIOGRAPHY;  Surgeon: Adrian Prows, MD;  Location: [facility];  Service: Cardiovascular;  Laterality: N/A;   Family History  Problem Relation Age of Onset  . Cardiomyopathy Sister 46       died of complications of cardiomyopathy  . Hypertension Sister   . Coronary artery disease Father 34       CABG  . Hypertension Father   . Heart attack Father   . Colon cancer Paternal Grandmother   . Diabetes Brother   . Hypertension Brother   . Cardiomyopathy Brother   . Hypertension Mother     Social History   Tobacco Use  . Smoking status: Never Smoker  . Smokeless tobacco: Never Used  . Tobacco comment: lives with a heavy smoker  Substance Use Topics  . Alcohol use: Yes  Alcohol/week: 1.0 - 2.0 standard drink    Types: 1 - 2 Glasses of wine per week    Comment: 1-2 per month   Marital Status: Married  ROS  Review of Systems  Constitutional: Negative for malaise/fatigue and weight gain.  Cardiovascular: Positive for chest pain, dyspnea on exertion (improving) and leg swelling (left ankle, improving). Negative for claudication, near-syncope, orthopnea, palpitations, paroxysmal nocturnal dyspnea and syncope.  Respiratory: Negative for shortness of breath.   Hematologic/Lymphatic: Does not  bruise/bleed easily.  Gastrointestinal: Negative for melena.  Neurological: Negative for dizziness and weakness.   Objective  Blood pressure 123/80, pulse (!) 59, temperature 98.2 F (36.8 C), temperature source Temporal, resp. rate 16, height 5' 4.5" (1.638 m), weight 262 lb 12.8 oz (119.2 kg), last menstrual period 06/25/2000, SpO2 98 %.  Vitals with BMI 08/10/2020 07/26/2020 07/26/2020  Height 5' 4.5" - -  Weight 262 lbs 13 oz - -  BMI 03.54 - -  Systolic 656 812 751  Diastolic 80 69 65  Pulse 59 65 63     Physical Exam Constitutional:      Appearance: She is obese.  Cardiovascular:     Rate and Rhythm: Normal rate and regular rhythm.     Pulses: Intact distal pulses.          Carotid pulses are on the left side with bruit.    Heart sounds: Normal heart sounds. No murmur heard. No gallop.      Comments: No leg edema, no JVD. Right radial access well healed without ecchymosis, erythema, hematoma. Pulmonary:     Effort: Pulmonary effort is normal.     Breath sounds: Normal breath sounds.  Musculoskeletal:     Right lower leg: Edema (trace) present.     Left lower leg: Edema (trace) present.  Skin:    General: Skin is warm and dry.     Capillary Refill: Capillary refill takes less than 2 seconds.  Neurological:     General: No focal deficit present.     Mental Status: She is oriented to person, place, and time.    Laboratory examination:   Recent Labs    05/24/20 1445 07/20/20 1330  NA 141 140  K 3.9 3.5  CL 101 101  CO2 26 27  GLUCOSE 109* 105*  BUN 16 17  CREATININE 0.78 0.81  CALCIUM 9.2 9.1  GFRNONAA 79 76  GFRAA 92 88   estimated creatinine clearance is 87.6 mL/min (by C-G formula based on SCr of 0.81 mg/dL).  CMP Latest Ref Rng & Units 07/20/2020 05/24/2020  Glucose 65 - 99 mg/dL 105(H) 109(H)  BUN 8 - 27 mg/dL 17 16  Creatinine 0.57 - 1.00 mg/dL 0.81 0.78  Sodium 134 - 144 mmol/L 140 141  Potassium 3.5 - 5.2 mmol/L 3.5 3.9  Chloride 96 - 106 mmol/L 101  101  CO2 20 - 29 mmol/L 27 26  Calcium 8.7 - 10.3 mg/dL 9.1 9.2   CBC Latest Ref Rng & Units 07/20/2020  WBC 3.4 - 10.8 x10E3/uL 9.4  Hemoglobin 11.1 - 15.9 g/dL 13.5  Hematocrit 34.0 - 46.6 % 41.5  Platelets 150 - 450 x10E3/uL 252    Lipid Panel No results for input(s): CHOL, TRIG, LDLCALC, VLDL, HDL, CHOLHDL, LDLDIRECT in the last 8760 hours.  HEMOGLOBIN A1C No results found for: HGBA1C, MPG TSH No results for input(s): TSH in the last 8760 hours.  External labs:   07/13/2019: Total cholesterol 116, triglycerides 109, HDL 50, LDL 47.  Normal  particle number and size. High sensitive CRP >10.0. Vitamin D 44.2. A1c 6.4%. BUN 17, creatinine 0.92, EGFR 65 mL, potassium 3.9, sodium 139, CMP normal. Hb 13.8/HCT 41.6, platelets 233.  Medications and allergies   Allergies  Allergen Reactions  . Shrimp [Shellfish Allergy] Hives and Swelling  . Oxycodone-Acetaminophen Nausea Only  . Vicodin [Hydrocodone-Acetaminophen] Nausea Only          Outpatient Medications Prior to Visit  Medication Sig Dispense Refill  . albuterol (PROVENTIL HFA;VENTOLIN HFA) 108 (90 BASE) MCG/ACT inhaler Inhale 2 puffs into the lungs every 6 (six) hours as needed.    . ALPRAZolam (XANAX) 0.5 MG tablet Take 0.5 mg by mouth 2 (two) times daily as needed for sleep.    Marland Kitchen aspirin (ASPIRIN CHILDRENS) 81 MG chewable tablet Chew 1 tablet (81 mg total) by mouth daily. 180 tablet 2  . Calcium Carbonate-Vitamin D 600-200 MG-UNIT CAPS Take 1 tablet by mouth daily.    . chlorthalidone (HYGROTON) 25 MG tablet Take 25 mg by mouth daily.     . Cholecalciferol 25 MCG (1000 UT) capsule Take 1,000 Units by mouth daily.    . clobetasol ointment (TEMOVATE) 0.05 % Apple topically to affected area BID for no more than 7 days. (Patient taking differently: Apply 1 application topically daily as needed (Skin Condition).) 30 g 0  . ezetimibe (ZETIA) 10 MG tablet Take 1 tablet (10 mg total) by mouth daily. 30 tablet 0  .  famciclovir (FAMVIR) 250 MG tablet Take 250 mg by mouth daily as needed (fever blisters).    . fexofenadine (ALLEGRA) 180 MG tablet Take 180 mg by mouth daily as needed for allergies.     . fish oil-omega-3 fatty acids 1000 MG capsule Take 1,400 mg by mouth daily.    Marland Kitchen FLOVENT HFA 110 MCG/ACT inhaler Inhale 2 puffs into the lungs 2 (two) times daily.    . fluticasone (FLONASE) 50 MCG/ACT nasal spray Place 2 sprays into both nostrils daily as needed for allergies.     Marland Kitchen gabapentin (NEURONTIN) 600 MG tablet Take 300 mg by mouth daily.    Marland Kitchen ibuprofen (ADVIL,MOTRIN) 200 MG tablet Take 200-400 mg by mouth every 6 (six) hours as needed for mild pain or moderate pain.    Marland Kitchen losartan (COZAAR) 100 MG tablet Take 1 tablet (100 mg total) by mouth every evening. (Patient taking differently: Take 100 mg by mouth at bedtime.) 90 tablet 3  . metoprolol tartrate (LOPRESSOR) 25 MG tablet Take 1 tablet (25 mg total) by mouth 2 (two) times daily. 60 tablet 2  . naproxen sodium (ANAPROX) 220 MG tablet Take 220 mg by mouth daily as needed (arthritis).     . rosuvastatin (CRESTOR) 40 MG tablet Take 40 mg by mouth daily.    . Zinc 50 MG TABS Take 50 mg by mouth daily as needed (Build immune system).    . fluorometholone (FML) 0.1 % ophthalmic suspension Place 1 drop into the right eye 4 (four) times daily.     No facility-administered medications prior to visit.   Radiology:   No results found.  Cardiac Studies:   ABI 07/13/2019 done at PCP office: Bilateral ABI 1.08.  Echocardiogram 05/24/2020: Left ventricle cavity is normal in size. Mild concentric hypertrophy of the left ventricle. Normal global wall motion. Normal LV systolic function with EF 55%. Doppler evidence of grade I (impaired) diastolic dysfunction, normal LAP.  No significant valvular abnormality. Normal right atrial pressure.   Exercise Myoview stress test 05/30/2020: Exercise  nuclear stress test was performed using Bruce protocol. Patient  reached 6.3 METS, and 123% of age predicted maximum heart rate. Exercise capacity was low. Chest pain not reported. Exertional dyspnea reported. Heart rate and hemodynamic response were normal.  Stress EKG showed sinus tachycardia, low voltage,  2 mm horizontal/downsloping ST depressions in leads V4-V6, II, II, aVF, normalizing within 1 min into revoery.  SPECT images showed decreased tracer uptake in inferior/infeoseptal myocardium, worse in rest images. While tissue attenuation is likely, ischemia in this region cannot be excluded. Stress LVEF 71%. Intermediate risk study.  Compared to 03/06/2016 report of Lexiscan stress test, no ST-T wave changes and normal perfusion.  Coronary calcium score 06/02/2020: Total calcium score 1442. Left main: 38 LAD 328 CX 142 RCA 933. Total calcium score of 1442 is between 90th and 100 percentile for females age between 55-69 years.  Implies extensive atherosclerotic disease and risk of CAD and high likelihood of at least one significant coronary narrowing.  Left Heart Catheterization 07/26/20:  LV: Normal LV systolic function, EF 55 to 60%.  Normal LVEDP.  No pressure gradient across the aortic valve. Left main: Large vessel, minimal calcification evident. LAD: Large caliber vessel.  Gives origin to a small D1 and then trifurcates into a large SP2, large D2 which is LAD equivalent and a small to moderate-sized distal LAD.  At the trifurcation, there is a mildly calcific 80% stenosis and ostial D2 has a 40% stenosis.  Mild calcification of proximal LAD. CX: Large vessel, gives origin to large OM1 and OM 2, after origin of OM 2, mid circumflex to distal circumflex has a focal 40% stenosis. RCA: Large caliber vessel, giving origin to a very large PL branch and PDA branch, supplying the apex of the left ventricle as well. Recommendation: Patient's symptoms are related to LAD stenosis.  However as this is a mid to distal LAD stenosis, not life-threatening, and not  high-grade, I would like to try medical therapy first.  At least she is now restratified and there is no ostial stenosis of major vessels.  She also appears motivated in making lifestyle changes.  90 mL contrast utilized.   EKG:   EKG 05/16/2020: Normal sinus rhythm at rate of 64 bpm, normal axis.  No evidence of ischemia, normal EKG.     Assessment     ICD-10-CM   1. Coronary artery disease of native artery of native heart with stable angina pectoris ([place])  I25.118   2. Primary hypertension  I10   3. Hypercholesteremia  E78.00   4. Dyspnea on exertion  R06.00     Medications Discontinued During This Encounter  Medication Reason  . fluorometholone (FML) 0.1 % ophthalmic suspension Error    Meds ordered this encounter  Medications  . isosorbide mononitrate (IMDUR) 30 MG 24 hr tablet    Sig: Take 1 tablet (30 mg total) by mouth daily.    Dispense:  30 tablet    Refill:  3   No orders of the defined types were placed in this encounter.   Recommendations:   Tracy Cabrera is a 65 y.o. Caucasian female with a strong family history of premature coronary disease in father at age 64 had CABG, her brother recently had stenting in his age 31 to 68 years, family history of "cardiomyopathy" in her sister who died at the age of 27 years in 1995, details not available, no other family members with sudden cardiac death or recurrent syncope. Patient has history of hypertension, prediabetes  and hyperlipidemia. She has had sensorineural hearing loss diagnosed July 2021, mild obstructive sleep apnea on CPAP in 2018,  COVID-19 infection in December 2020, she is now fully vaccinated.  Patient presents for follow-up after left heart catheterization on 07/26/2020. She is presently tolerating guideline directed medical therapy well.  We will continue aspirin, Zetia, losartan, metoprolol, and rosuvastatin.  As patient's heart rate today is less than 60 bpm, will avoid increasing beta-blocker therapy at this  time.  Dyspnea has significantly improved since patient has began exercising.  Suspect underlying etiology of her dyspnea to be related to deconditioning and asthma.  She does continue to have intermittent chest pain, which is nonexertional.  We will therefore add isosorbide mononitrate 30 mg daily to improve symptom control.  Advised patient regarding side effects including hypertension and headache, patient verbalized understanding and agrees to try isosorbide mononitrate. She will notify our office if she is unable to tolerate it.   Discussed and reviewed results of left heart catheterization, details above. Heart catheterization did reveal mid to distal LAD stenosis and 40% focal stenosis of distal circumflex. Medical management is recommended as this is a mid to distal LAD stenosis, not life-threatening, and not high-grade.  At least she is now restratified and there is no ostial stenosis of major vessels.   Congratulated patient on diet and lifestyle modifications since cardiac catheterization. Encouraged her to continue to focus on continued daily exercise and weight loss as well as low-sodium diet. Patient brought with her a list of several questions regarding cardiac catheterization addressed all questions to patient satisfaction. Audibly patient remains anxious regarding her health in view of strong family history of coronary artery disease.  Blood pressure and lipids are well controlled.    This was a 35-minute encounter with face-to-face counseling, medical records review, coordination of care, explanation of complex medical issues, complex medical decision making, addressing patient questions to her satisfaction.    Alethia Berthold, PA-C 08/10/2020, 12:11 PM Office: [phone]

## 2020-08-10 ENCOUNTER — Ambulatory Visit: Payer: PPO | Admitting: Student

## 2020-08-10 ENCOUNTER — Other Ambulatory Visit: Payer: Self-pay

## 2020-08-10 ENCOUNTER — Encounter: Payer: Self-pay | Admitting: Student

## 2020-08-10 VITALS — BP 123/80 | HR 59 | Temp 98.2°F | Resp 16 | Ht 64.5 in | Wt 262.8 lb

## 2020-08-10 DIAGNOSIS — I25118 Atherosclerotic heart disease of native coronary artery with other forms of angina pectoris: Secondary | ICD-10-CM

## 2020-08-10 DIAGNOSIS — I10 Essential (primary) hypertension: Secondary | ICD-10-CM

## 2020-08-10 DIAGNOSIS — R0609 Other forms of dyspnea: Secondary | ICD-10-CM

## 2020-08-10 DIAGNOSIS — E7800 Pure hypercholesterolemia, unspecified: Secondary | ICD-10-CM | POA: Diagnosis not present

## 2020-08-10 DIAGNOSIS — R0600 Dyspnea, unspecified: Secondary | ICD-10-CM

## 2020-08-10 MED ORDER — ISOSORBIDE MONONITRATE ER 30 MG PO TB24: 30.0000 mg | ORAL_TABLET | Freq: Every day | ORAL | 3 refills | Status: DC

## 2020-08-10 NOTE — Telephone Encounter (Signed)
From patient.

## 2020-08-16 NOTE — Progress Notes (Signed)
65 y.o. G26P2002 Married White or Caucasian female here for breast & pelvic.    Recently got diagnosed with coronary blockage and is changing everything, eating health, recumbent biking and trying to lose weight    Hx lichen sclerosis, yesterday feels like a flare, itching    Patient's last menstrual period was 06/25/2000.          Sexually active: Yes.    The current method of family planning is post menopausal status.    Exercising: Yes.    bike Smoker:  no  Health Maintenance: Pap:  11-30-16 neg HPV HR neg History of abnormal Pap:  no MMG:  2022 patient will have it faxed to [place] Colonoscopy:  05-28-2019 f/u 60yrs BMD:   2020 mild osteopenia TDaP:  Done with pcp Gardasil:   n/a Covid-19: pfizer Hep C testing: done with pcp  reports that she has never smoked. She has never used smokeless tobacco. She reports current alcohol use. She reports that she does not use drugs.  Past Medical History:  Diagnosis Date  . Asthma   . Blockage of coronary artery of heart (HCC)    80%  . Blood transfusion without reported diagnosis 1973  . Fracture of ankle, left, closed 05/2015  . History of colon polyps 01/2008   Dr. Idolina Primer due 2015  . Hyperlipidemia   . Hypertension   . Prediabetes   . Restless leg syndrome   . Sleep apnea    Mild    Past Surgical History:  Procedure Laterality Date  . ANKLE FRACTURE SURGERY Left 05/2015  . BREAST SURGERY  09/2009   benign bx--fibrocystic changes  . CATARACT EXTRACTION Right 05/02/2020  . L5/S1 discectomy  2005  . Left eye surgery for a retinal tear    . LEFT HEART CATH AND CORONARY ANGIOGRAPHY N/A 07/26/2020   Procedure: LEFT HEART CATH AND CORONARY ANGIOGRAPHY;  Surgeon: Adrian Prows, MD;  Location: [facility];  Service: Cardiovascular;  Laterality: N/A;    Current Outpatient Medications  Medication Sig Dispense Refill  . albuterol (PROVENTIL HFA;VENTOLIN HFA) 108 (90 BASE) MCG/ACT inhaler Inhale 2 puffs into the lungs every 6 (six)  hours as needed.    . ALPRAZolam (XANAX) 0.5 MG tablet Take 0.5 mg by mouth 2 (two) times daily as needed for sleep.    Marland Kitchen aspirin (ASPIRIN CHILDRENS) 81 MG chewable tablet Chew 1 tablet (81 mg total) by mouth daily. 180 tablet 2  . Calcium Carbonate-Vitamin D 600-200 MG-UNIT CAPS Take 1 tablet by mouth daily.    . chlorthalidone (HYGROTON) 25 MG tablet Take 25 mg by mouth daily.     . Cholecalciferol 25 MCG (1000 UT) capsule Take 1,000 Units by mouth daily.    . clobetasol ointment (TEMOVATE) 0.05 % Apple topically to affected area BID for no more than 7 days. (Patient taking differently: Apply 1 application topically daily as needed (Skin Condition).) 30 g 0  . ezetimibe (ZETIA) 10 MG tablet Take 1 tablet (10 mg total) by mouth daily. 30 tablet 0  . famciclovir (FAMVIR) 250 MG tablet Take 250 mg by mouth daily as needed (fever blisters).    . fexofenadine (ALLEGRA) 180 MG tablet Take 180 mg by mouth daily as needed for allergies.     . fish oil-omega-3 fatty acids 1000 MG capsule Take 1,400 mg by mouth daily.    Marland Kitchen FLOVENT HFA 110 MCG/ACT inhaler Inhale 2 puffs into the lungs 2 (two) times daily.    . fluticasone (FLONASE) 50  MCG/ACT nasal spray Place 2 sprays into both nostrils daily as needed for allergies.     Marland Kitchen gabapentin (NEURONTIN) 600 MG tablet Take 300 mg by mouth daily.    Marland Kitchen ibuprofen (ADVIL,MOTRIN) 200 MG tablet Take 200-400 mg by mouth every 6 (six) hours as needed for mild pain or moderate pain.    . isosorbide mononitrate (IMDUR) 30 MG 24 hr tablet Take 1 tablet (30 mg total) by mouth daily. 30 tablet 3  . losartan (COZAAR) 100 MG tablet Take 1 tablet (100 mg total) by mouth every evening. (Patient taking differently: Take 100 mg by mouth at bedtime.) 90 tablet 3  . metoprolol tartrate (LOPRESSOR) 25 MG tablet Take 1 tablet (25 mg total) by mouth 2 (two) times daily. 60 tablet 2  . naproxen sodium (ANAPROX) 220 MG tablet Take 220 mg by mouth daily as needed (arthritis).     .  rosuvastatin (CRESTOR) 40 MG tablet Take 40 mg by mouth daily.     No current facility-administered medications for this visit.    Family History  Problem Relation Age of Onset  . Cardiomyopathy Sister 79       died of complications of cardiomyopathy  . Hypertension Sister   . Coronary artery disease Father 49       CABG  . Hypertension Father   . Heart attack Father   . Colon cancer Paternal Grandmother   . Diabetes Brother   . Hypertension Brother   . Cardiomyopathy Brother   . Coronary artery disease Brother   . COPD Brother   . Hypertension Mother     Review of Systems  Constitutional: Negative.   HENT: Negative.   Eyes: Negative.   Respiratory: Negative.   Cardiovascular: Negative.   Gastrointestinal: Negative.   Endocrine: Negative.   Genitourinary: Negative.   Musculoskeletal: Negative.   Skin: Negative.   Allergic/Immunologic: Negative.   Neurological: Negative.   Hematological: Negative.   Psychiatric/Behavioral: Negative.     Exam:   BP 110/64   Pulse 84   Resp 16   Ht 5' 4.25" (1.632 m)   Wt 262 lb (118.8 kg)   LMP 06/25/2000   BMI 44.62 kg/m   Height: 5' 4.25" (163.2 cm)  General appearance: alert, cooperative and appears stated  Breasts: No axillary or supraclavicular adenopathy, Normal to palpation without dominant masses No abnormal inguinal nodes palpated Neurologic: Grossly normal   Pelvic: External genitalia:  Lichen tissue most noticeable around clitoris, perineal and anal area              Urethra:  normal appearing urethra with no masses, tenderness or lesions              Bartholins and Skenes: normal                 Vagina: normal appearing vagina,atrophic appropriate for age, normal appearing discharge, no lesions              Cervix: neg cervical motion tenderness, no visible lesions             Bimanual Exam:   Uterus:  normal size, contour, position, consistency, mobility, non-tender              Adnexa: no mass, fullness,  tenderness               Rectal: no palpable mass  Joy, CMA Chaperone was present for exam.  AP:    Well woman exam/Breast and Pelvic Screening  Lichen  sclerosus - Plan: clobetasol ointment (TEMOVATE) 0.05 %  Pap :done 11/2016, co-testing due 2023  Mammogram: done at [place], had done in 2022, requested results  F/U 1 year, encouraged evaluation of lichen sclerosis annually

## 2020-08-19 ENCOUNTER — Other Ambulatory Visit: Payer: Self-pay

## 2020-08-19 ENCOUNTER — Ambulatory Visit (INDEPENDENT_AMBULATORY_CARE_PROVIDER_SITE_OTHER): Payer: PPO | Admitting: Nurse Practitioner

## 2020-08-19 ENCOUNTER — Encounter: Payer: Self-pay | Admitting: Nurse Practitioner

## 2020-08-19 ENCOUNTER — Ambulatory Visit: Payer: PPO

## 2020-08-19 VITALS — BP 110/64 | HR 84 | Resp 16 | Ht 64.25 in | Wt 262.0 lb

## 2020-08-19 DIAGNOSIS — L900 Lichen sclerosus et atrophicus: Secondary | ICD-10-CM

## 2020-08-19 DIAGNOSIS — Z01419 Encounter for gynecological examination (general) (routine) without abnormal findings: Secondary | ICD-10-CM

## 2020-08-19 MED ORDER — CLOBETASOL PROPIONATE 0.05 % EX OINT: TOPICAL_OINTMENT | CUTANEOUS | 2 refills | Status: DC

## 2020-08-19 NOTE — Patient Instructions (Signed)
Health Maintenance After Age 67 After age 67, you are at a higher risk for certain long-term diseases and infections as well as injuries from falls. Falls are a major cause of broken bones and head injuries in people who are older than age 67. Getting regular preventive care can help to keep you healthy and well. Preventive care includes getting regular testing and making lifestyle changes as recommended by your health care provider. Talk with your health care provider about:  Which screenings and tests you should have. A screening is a test that checks for a disease when you have no symptoms.  A diet and exercise plan that is right for you. What should I know about screenings and tests to prevent falls? Screening and testing are the best ways to find a health problem early. Early diagnosis and treatment give you the best chance of managing medical conditions that are common after age 67. Certain conditions and lifestyle choices may make you more likely to have a fall. Your health care provider may recommend:  Regular vision checks. Poor vision and conditions such as cataracts can make you more likely to have a fall. If you wear glasses, make sure to get your prescription updated if your vision changes.  Medicine review. Work with your health care provider to regularly review all of the medicines you are taking, including over-the-counter medicines. Ask your health care provider about any side effects that may make you more likely to have a fall. Tell your health care provider if any medicines that you take make you feel dizzy or sleepy.  Osteoporosis screening. Osteoporosis is a condition that causes the bones to get weaker. This can make the bones weak and cause them to break more easily.  Blood pressure screening. Blood pressure changes and medicines to control blood pressure can make you feel dizzy.  Strength and balance checks. Your health care provider may recommend certain tests to check your  strength and balance while standing, walking, or changing positions.  Foot health exam. Foot pain and numbness, as well as not wearing proper footwear, can make you more likely to have a fall.  Depression screening. You may be more likely to have a fall if you have a fear of falling, feel emotionally low, or feel unable to do activities that you used to do.  Alcohol use screening. Using too much alcohol can affect your balance and may make you more likely to have a fall. What actions can I take to lower my risk of falls? General instructions  Talk with your health care provider about your risks for falling. Tell your health care provider if: ? You fall. Be sure to tell your health care provider about all falls, even ones that seem minor. ? You feel dizzy, sleepy, or off-balance.  Take over-the-counter and prescription medicines only as told by your health care provider. These include any supplements.  Eat a healthy diet and maintain a healthy weight. A healthy diet includes low-fat dairy products, low-fat (lean) meats, and fiber from whole grains, beans, and lots of fruits and vegetables. Home safety  Remove any tripping hazards, such as rugs, cords, and clutter.  Install safety equipment such as grab bars in bathrooms and safety rails on stairs.  Keep rooms and walkways well-lit. Activity  Follow a regular exercise program to stay fit. This will help you maintain your balance. Ask your health care provider what types of exercise are appropriate for you.  If you need a cane or walker,   use it as recommended by your health care provider.  Wear supportive shoes that have nonskid soles.   Lifestyle  Do not drink alcohol if your health care provider tells you not to drink.  If you drink alcohol, limit how much you have: ? 0-1 drink a day for women. ? 0-2 drinks a day for men.  Be aware of how much alcohol is in your drink. In the [place] one drink equals one typical bottle of beer (12  oz), one-half glass of wine (5 oz), or one shot of hard liquor (1 oz).  Do not use any products that contain nicotine or tobacco, such as cigarettes and e-cigarettes. If you need help quitting, ask your health care provider. Summary  Having a healthy lifestyle and getting preventive care can help to protect your health and wellness after age 67.  Screening and testing are the best way to find a health problem early and help you avoid having a fall. Early diagnosis and treatment give you the best chance for managing medical conditions that are more common for people who are older than age 67.  Falls are a major cause of broken bones and head injuries in people who are older than age 67. Take precautions to prevent a fall at home.  Work with your health care provider to learn what changes you can make to improve your health and wellness and to prevent falls. This information is not intended to replace advice given to you by your health care provider. Make sure you discuss any questions you have with your health care provider. Document Revised: 10/02/2018 Document Reviewed: 04/24/2017 [employer] Patient Education  2021 [employer]  

## 2020-08-22 ENCOUNTER — Other Ambulatory Visit: Payer: Self-pay

## 2020-08-22 ENCOUNTER — Ambulatory Visit (HOSPITAL_COMMUNITY)
Admission: RE | Admit: 2020-08-22 | Discharge: 2020-08-22 | Disposition: A | Discharge status: Home / Self Care | Payer: PPO | Source: Ambulatory Visit | Attending: Family Medicine | Admitting: Family Medicine

## 2020-08-22 ENCOUNTER — Other Ambulatory Visit (HOSPITAL_COMMUNITY): Payer: Self-pay | Admitting: Family Medicine

## 2020-08-22 DIAGNOSIS — R0989 Other specified symptoms and signs involving the circulatory and respiratory systems: Secondary | ICD-10-CM | POA: Insufficient documentation

## 2020-08-24 ENCOUNTER — Ambulatory Visit: Payer: PPO | Admitting: Cardiology

## 2020-08-25 ENCOUNTER — Encounter (HOSPITAL_COMMUNITY): Payer: Self-pay

## 2020-09-20 NOTE — Progress Notes (Signed)
Primary Physician/Referring:  Derinda Late, MD  Patient ID: Tracy Cabrera, female    DOB: 1956-04-18, 65 y.o.   MRN: [id number]  Chief Complaint  Patient presents with  . Coronary artery disease of native artery of native heart wi  . Follow-up   HPI:    Tracy Cabrera  is a 65 y.o. Caucasian female with a strong family history of premature coronary disease, family history of "cardiomyopathy" in her sister who died at the age of 37 years in 1995, details not available, no other family members with sudden cardiac death or recurrent syncope. Patient has history of hypertension, prediabetes and hyperlipidemia. She has had sensorineural hearing loss diagnosed July 2021, mild obstructive sleep apnea on CPAP in 2018,  COVID-19 infection in December 2020, she is now fully vaccinated.  Patient underwent left heart catheterization on 07/26/2290, which revealed revealed mid to distal LAD stenosis which is not high-grade, and medical therapy was recommended by Dr. Einar Gip.   Patient presents for 6-week follow-up of CAD with stable angina.  At last visit added isosorbide mononitrate 30 mg daily.  Patient states she had headaches for approximately 4 days following initiation of isosorbide mononitrate, which have now resolved.  She is tolerating it well.  She does report an episode of 4 days of intermittent "chest tightness" around a stressful work deadline.  This episode was approximately 2 weeks ago, and she has had no recurrence since then.  She continues to exercise on a regular basis, for about 40 minutes daily without issue.  She also continues to follow strict diet and has lost an additional 8 pounds since last visit.  Blood pressure remains well controlled.  She continues to tolerate guideline directed medical therapy including aspirin, losartan, metoprolol, and rosuvastatin.  Denies palpitations, dyspnea, syncope, near syncope, dizziness, lightheadedness.  Denies leg swelling, orthopnea, PND.  Past  Medical History:  Diagnosis Date  . Asthma   . Blockage of coronary artery of heart (HCC)    80%  . Blood transfusion without reported diagnosis 1973  . Fracture of ankle, left, closed 05/2015  . History of colon polyps 01/2008   Dr. Idolina Primer due 2015  . Hyperlipidemia   . Hypertension   . Prediabetes   . Restless leg syndrome   . Sleep apnea    Mild   Past Surgical History:  Procedure Laterality Date  . ANKLE FRACTURE SURGERY Left 05/2015  . BREAST SURGERY  09/2009   benign bx--fibrocystic changes  . CATARACT EXTRACTION Right 05/02/2020  . L5/S1 discectomy  2005  . Left eye surgery for a retinal tear    . LEFT HEART CATH AND CORONARY ANGIOGRAPHY N/A 07/26/2020   Procedure: LEFT HEART CATH AND CORONARY ANGIOGRAPHY;  Surgeon: Adrian Prows, MD;  Location: [facility];  Service: Cardiovascular;  Laterality: N/A;   Family History  Problem Relation Age of Onset  . Cardiomyopathy Sister 39       died of complications of cardiomyopathy  . Hypertension Sister   . Coronary artery disease Father 64       CABG  . Hypertension Father   . Heart attack Father   . Colon cancer Paternal Grandmother   . Diabetes Brother   . Hypertension Brother   . Cardiomyopathy Brother   . Coronary artery disease Brother   . COPD Brother   . Hypertension Mother     Social History   Tobacco Use  . Smoking status: Never Smoker  . Smokeless tobacco: Never Used  .  Tobacco comment: lives with a heavy smoker  Substance Use Topics  . Alcohol use: Yes    Comment: 1-2 per month   Marital Status: Married  ROS  Review of Systems  Constitutional: Negative for malaise/fatigue and weight gain.  Cardiovascular: Positive for chest pain and leg swelling (left ankle). Negative for claudication, dyspnea on exertion, near-syncope, orthopnea, palpitations, paroxysmal nocturnal dyspnea and syncope.  Respiratory: Negative for shortness of breath.   Hematologic/Lymphatic: Does not bruise/bleed easily.   Gastrointestinal: Negative for melena.  Neurological: Negative for dizziness and weakness.   Objective  Blood pressure 108/68, pulse (!) 51, temperature 97.7 F (36.5 C), temperature source Temporal, resp. rate 16, height 5' 4"  (1.626 m), weight 256 lb (116.1 kg), last menstrual period 06/25/2000, SpO2 98 %.  Vitals with BMI 09/21/2020 08/19/2020 08/10/2020  Height 5' 4"  5' 4.25" 5' 4.5"  Weight 256 lbs 262 lbs 262 lbs 13 oz  BMI 43.92 64.15 83.09  Systolic 407 680 881  Diastolic 68 64 80  Pulse 51 84 59     Physical Exam Constitutional:      Appearance: She is obese.  Cardiovascular:     Rate and Rhythm: Normal rate and regular rhythm.     Pulses: Intact distal pulses.          Carotid pulses are on the left side with bruit.    Heart sounds: Normal heart sounds. No murmur heard. No gallop.      Comments: No JVD. Pulmonary:     Effort: Pulmonary effort is normal.     Breath sounds: Normal breath sounds.  Musculoskeletal:     Right lower leg: No edema.     Left lower leg: Edema (trace at the ankle) present.  Skin:    General: Skin is warm and dry.     Capillary Refill: Capillary refill takes less than 2 seconds.  Neurological:     General: No focal deficit present.     Mental Status: She is oriented to person, place, and time.    Laboratory examination:   Recent Labs    05/24/20 1445 07/20/20 1330  NA 141 140  K 3.9 3.5  CL 101 101  CO2 26 27  GLUCOSE 109* 105*  BUN 16 17  CREATININE 0.78 0.81  CALCIUM 9.2 9.1  GFRNONAA 79 76  GFRAA 92 88   CrCl cannot be calculated (Patient's most recent lab result is older than the maximum 21 days allowed.).  CMP Latest Ref Rng & Units 07/20/2020 05/24/2020  Glucose 65 - 99 mg/dL 105(H) 109(H)  BUN 8 - 27 mg/dL 17 16  Creatinine 0.57 - 1.00 mg/dL 0.81 0.78  Sodium 134 - 144 mmol/L 140 141  Potassium 3.5 - 5.2 mmol/L 3.5 3.9  Chloride 96 - 106 mmol/L 101 101  CO2 20 - 29 mmol/L 27 26  Calcium 8.7 - 10.3 mg/dL 9.1 9.2    CBC Latest Ref Rng & Units 07/20/2020  WBC 3.4 - 10.8 x10E3/uL 9.4  Hemoglobin 11.1 - 15.9 g/dL 13.5  Hematocrit 34.0 - 46.6 % 41.5  Platelets 150 - 450 x10E3/uL 252    Lipid Panel No results for input(s): CHOL, TRIG, LDLCALC, VLDL, HDL, CHOLHDL, LDLDIRECT in the last 8760 hours.  HEMOGLOBIN A1C No results found for: HGBA1C, MPG TSH No results for input(s): TSH in the last 8760 hours.  External labs:   07/13/2019: Total cholesterol 116, triglycerides 109, HDL 50, LDL 47.  Normal particle number and size. High sensitive CRP >10.0. Vitamin D  44.2. A1c 6.4%. BUN 17, creatinine 0.92, EGFR 65 mL, potassium 3.9, sodium 139, CMP normal. Hb 13.8/HCT 41.6, platelets 233.  Medications and allergies   Allergies  Allergen Reactions  . Shrimp [Shellfish Allergy] Hives and Swelling  . Other     fluress eye drops  . Oxycodone-Acetaminophen Nausea Only  . Vicodin [Hydrocodone-Acetaminophen] Nausea Only          Outpatient Medications Prior to Visit  Medication Sig Dispense Refill  . albuterol (PROVENTIL HFA;VENTOLIN HFA) 108 (90 BASE) MCG/ACT inhaler Inhale 2 puffs into the lungs every 6 (six) hours as needed.    . ALPRAZolam (XANAX) 0.5 MG tablet Take 0.5 mg by mouth 2 (two) times daily as needed for sleep.    Marland Kitchen aspirin (ASPIRIN CHILDRENS) 81 MG chewable tablet Chew 1 tablet (81 mg total) by mouth daily. 180 tablet 2  . Calcium Carbonate-Vitamin D 600-200 MG-UNIT CAPS Take 1 tablet by mouth daily.    . chlorthalidone (HYGROTON) 25 MG tablet Take 25 mg by mouth daily.     . Cholecalciferol 25 MCG (1000 UT) capsule Take 1,000 Units by mouth daily.    . clobetasol ointment (TEMOVATE) 0.05 % Apple topically to affected area BID for no more than 7 days. 30 g 2  . ezetimibe (ZETIA) 10 MG tablet Take 1 tablet (10 mg total) by mouth daily. 30 tablet 0  . famciclovir (FAMVIR) 250 MG tablet Take 250 mg by mouth daily as needed (fever blisters).    . fexofenadine (ALLEGRA) 180 MG tablet  Take 180 mg by mouth daily as needed for allergies.     . fish oil-omega-3 fatty acids 1000 MG capsule Take 1,400 mg by mouth daily.    Marland Kitchen FLOVENT HFA 110 MCG/ACT inhaler Inhale 2 puffs into the lungs 2 (two) times daily.    . fluticasone (FLONASE) 50 MCG/ACT nasal spray Place 2 sprays into both nostrils daily as needed for allergies.     Marland Kitchen gabapentin (NEURONTIN) 600 MG tablet Take 300 mg by mouth daily.    Marland Kitchen ibuprofen (ADVIL,MOTRIN) 200 MG tablet Take 200-400 mg by mouth every 6 (six) hours as needed for mild pain or moderate pain.    . isosorbide mononitrate (IMDUR) 30 MG 24 hr tablet Take 1 tablet (30 mg total) by mouth daily. 30 tablet 3  . losartan (COZAAR) 100 MG tablet Take 1 tablet (100 mg total) by mouth every evening. (Patient taking differently: Take 100 mg by mouth at bedtime.) 90 tablet 3  . metoprolol tartrate (LOPRESSOR) 25 MG tablet Take 1 tablet (25 mg total) by mouth 2 (two) times daily. 60 tablet 2  . naproxen sodium (ANAPROX) 220 MG tablet Take 220 mg by mouth daily as needed (arthritis).     . rosuvastatin (CRESTOR) 40 MG tablet Take 40 mg by mouth daily.     No facility-administered medications prior to visit.   Radiology:   No results found.  Cardiac Studies:   ABI 07/13/2019 done at PCP office: Bilateral ABI 1.08.  Echocardiogram 05/24/2020: Left ventricle cavity is normal in size. Mild concentric hypertrophy of the left ventricle. Normal global wall motion. Normal LV systolic function with EF 55%. Doppler evidence of grade I (impaired) diastolic dysfunction, normal LAP.  No significant valvular abnormality. Normal right atrial pressure.   Exercise Myoview stress test 05/30/2020: Exercise nuclear stress test was performed using Bruce protocol. Patient reached 6.3 METS, and 123% of age predicted maximum heart rate. Exercise capacity was low. Chest pain not reported. Exertional dyspnea  reported. Heart rate and hemodynamic response were normal.  Stress EKG showed sinus  tachycardia, low voltage,  2 mm horizontal/downsloping ST depressions in leads V4-V6, II, II, aVF, normalizing within 1 min into revoery.  SPECT images showed decreased tracer uptake in inferior/infeoseptal myocardium, worse in rest images. While tissue attenuation is likely, ischemia in this region cannot be excluded. Stress LVEF 71%. Intermediate risk study.  Compared to 03/06/2016 report of Lexiscan stress test, no ST-T wave changes and normal perfusion.  Coronary calcium score 06/02/2020: Total calcium score 1442. Left main: 38 LAD 328 CX 142 RCA 933. Total calcium score of 1442 is between 90th and 100 percentile for females age between 23-69 years.  Implies extensive atherosclerotic disease and risk of CAD and high likelihood of at least one significant coronary narrowing.  Left Heart Catheterization 07/26/20:  LV: Normal LV systolic function, EF 55 to 60%.  Normal LVEDP.  No pressure gradient across the aortic valve. Left main: Large vessel, minimal calcification evident. LAD: Large caliber vessel.  Gives origin to a small D1 and then trifurcates into a large SP2, large D2 which is LAD equivalent and a small to moderate-sized distal LAD.  At the trifurcation, there is a mildly calcific 80% stenosis and ostial D2 has a 40% stenosis.  Mild calcification of proximal LAD. CX: Large vessel, gives origin to large OM1 and OM 2, after origin of OM 2, mid circumflex to distal circumflex has a focal 40% stenosis. RCA: Large caliber vessel, giving origin to a very large PL branch and PDA branch, supplying the apex of the left ventricle as well. Recommendation: Patient's symptoms are related to LAD stenosis.  However as this is a mid to distal LAD stenosis, not life-threatening, and not high-grade, I would like to try medical therapy first.  At least she is now restratified and there is no ostial stenosis of major vessels.  She also appears motivated in making lifestyle changes.  90 mL contrast  utilized.   EKG:   EKG 05/16/2020: Normal sinus rhythm at rate of 64 bpm, normal axis.  No evidence of ischemia, normal EKG.     Assessment     ICD-10-CM   1. Coronary artery disease of native artery of native heart with stable angina pectoris ([place]  I25.118   2. Primary hypertension  I10     There are no discontinued medications.  Meds ordered this encounter  Medications  . nitroGLYCERIN (NITROSTAT) 0.4 MG SL tablet    Sig: Place 1 tablet (0.4 mg total) under the tongue every 5 (five) minutes as needed for chest pain.    Dispense:  30 tablet    Refill:  3   No orders of the defined types were placed in this encounter.   Recommendations:   ADANELY REYNOSO is a 65 y.o. Caucasian female with a strong family history of premature coronary disease, family history of "cardiomyopathy" in her sister who died at the age of 69 years in 1995, details not available, no other family members with sudden cardiac death or recurrent syncope. Patient has history of hypertension, prediabetes and hyperlipidemia. She has had sensorineural hearing loss diagnosed July 2021, mild obstructive sleep apnea on CPAP in 2018,  COVID-19 infection in December 2020, she is now fully vaccinated.   Patient underwent left heart catheterization on 07/26/2290, which revealed revealed mid to distal LAD stenosis which is not high-grade, and medical therapy was recommended by Dr. Einar Gip.   Patient presents for 6-week follow-up of CAD with stable  angina.  At last visit added isosorbide mononitrate 30 mg daily.  Patient is presently feeling well, without recurrence of anginal symptoms in the last 2 weeks and no clinical signs of heart failure.  She is tolerating guideline directed therapy without issue and is no longer having headaches with isosorbide mononitrate.  Suspect episode of 4 days of intermittent chest pain was related to high stress at work.  She has had no recurrence of chest pain since this episode.  Counseled patient  regarding signs and symptoms that would warrant urgent/emergent evaluation.  S/L NTG was prescribed and explained how to and when to use it and to notify us if there is change in frequency of use.  Also specifically counseled patient regarding monitoring her blood pressure and watching for symptoms including lightheadedness dizziness, or syncope as patient's blood pressure is already soft.  Patient verbalized understanding and agreement.  Patient's blood pressure and lipids remain well controlled.  We will not make changes to her medications at this time.  Patient has made significant changes regarding diet and lifestyle and she continues to lose weight as well as increase her physical activity.  She is tolerating 40 minutes of exercise daily without issue.  Encouraged her to continue to exercise and follow her strict diet.  Notably patient does remain anxious regarding her health in view of strong family history of CAD, however she is feeling very well overall.  Follow-up in 6 months, sooner if needed, for CAD and hyperlipidemia.   Alethia Berthold, PA-C 09/21/2020, 12:11 PM Office: [phone]

## 2020-09-21 ENCOUNTER — Encounter: Payer: Self-pay | Admitting: Student

## 2020-09-21 ENCOUNTER — Ambulatory Visit: Payer: PPO | Admitting: Student

## 2020-09-21 ENCOUNTER — Other Ambulatory Visit: Payer: Self-pay

## 2020-09-21 VITALS — BP 108/68 | HR 51 | Temp 97.7°F | Resp 16 | Ht 64.0 in | Wt 256.0 lb

## 2020-09-21 DIAGNOSIS — I10 Essential (primary) hypertension: Secondary | ICD-10-CM

## 2020-09-21 DIAGNOSIS — I25118 Atherosclerotic heart disease of native coronary artery with other forms of angina pectoris: Secondary | ICD-10-CM | POA: Diagnosis not present

## 2020-09-21 MED ORDER — NITROGLYCERIN 0.4 MG SL SUBL: 0.4000 mg | SUBLINGUAL_TABLET | SUBLINGUAL | 3 refills | Status: AC | PRN

## 2020-10-11 ENCOUNTER — Other Ambulatory Visit: Payer: Self-pay

## 2020-10-11 ENCOUNTER — Other Ambulatory Visit: Payer: Self-pay | Admitting: Cardiology

## 2020-10-11 DIAGNOSIS — I25118 Atherosclerotic heart disease of native coronary artery with other forms of angina pectoris: Secondary | ICD-10-CM

## 2020-10-11 DIAGNOSIS — K645 Perianal venous thrombosis: Secondary | ICD-10-CM | POA: Diagnosis not present

## 2020-10-11 MED ORDER — METOPROLOL TARTRATE 25 MG PO TABS: 25.0000 mg | ORAL_TABLET | Freq: Two times a day (BID) | ORAL | 3 refills | Status: DC

## 2020-10-13 DIAGNOSIS — K645 Perianal venous thrombosis: Secondary | ICD-10-CM | POA: Diagnosis not present

## 2020-12-05 ENCOUNTER — Other Ambulatory Visit: Payer: Self-pay | Admitting: Student

## 2021-01-24 DIAGNOSIS — R7302 Impaired glucose tolerance (oral): Secondary | ICD-10-CM | POA: Diagnosis not present

## 2021-02-16 DIAGNOSIS — S62292A Other fracture of first metacarpal bone, left hand, initial encounter for closed fracture: Secondary | ICD-10-CM | POA: Diagnosis not present

## 2021-02-16 DIAGNOSIS — R079 Chest pain, unspecified: Secondary | ICD-10-CM | POA: Diagnosis not present

## 2021-02-17 DIAGNOSIS — S62292D Other fracture of first metacarpal bone, left hand, subsequent encounter for fracture with routine healing: Secondary | ICD-10-CM | POA: Diagnosis not present

## 2021-02-23 DIAGNOSIS — S62292D Other fracture of first metacarpal bone, left hand, subsequent encounter for fracture with routine healing: Secondary | ICD-10-CM | POA: Diagnosis not present

## 2021-03-16 DIAGNOSIS — S62202G Unspecified fracture of first metacarpal bone, left hand, subsequent encounter for fracture with delayed healing: Secondary | ICD-10-CM | POA: Diagnosis not present

## 2021-03-20 ENCOUNTER — Other Ambulatory Visit: Payer: Self-pay

## 2021-03-20 DIAGNOSIS — L900 Lichen sclerosus et atrophicus: Secondary | ICD-10-CM

## 2021-03-20 NOTE — Telephone Encounter (Signed)
AEX with Garth Schlatter., NP on 08/19/20.

## 2021-03-21 DIAGNOSIS — S62242A Displaced fracture of shaft of first metacarpal bone, left hand, initial encounter for closed fracture: Secondary | ICD-10-CM | POA: Diagnosis not present

## 2021-03-21 MED ORDER — CLOBETASOL PROPIONATE 0.05 % EX OINT
TOPICAL_OINTMENT | CUTANEOUS | 0 refills | Status: DC
Start: 2021-03-21 — End: 2021-08-24

## 2021-03-21 NOTE — Telephone Encounter (Signed)
Please check with the patient how often she is using the clobetasol? Kelly sent in 30 grams with 2 refills. I wouldn't expect her to have gone through 90 grams. She should only be using the ointment with a flare for up to a week at a time. This is not meant for daily use. If she feels like she needs it, she can use a pea sized amount 1-2 x a week baseline.  I've sent another 30 grams, but she needs to use it sparingly. Using too much steroid can thin the skin.

## 2021-03-21 NOTE — Progress Notes (Signed)
Primary Physician/Referring:  Derinda Late, MD  Patient ID: Tracy Cabrera, female    DOB: 09/12/1955, 65 y.o.   MRN: [id number]  Chief Complaint  Patient presents with   Coronary Artery Disease   Follow-up   HPI:    Tracy Cabrera  is a 65 y.o. Caucasian female with a strong family history of premature coronary disease, family history of "cardiomyopathy" in her sister who died at the age of 68 years in 1995, details not available, no other family members with sudden cardiac death or recurrent syncope. Patient has history of hypertension, prediabetes and hyperlipidemia. She has had sensorineural hearing loss diagnosed July 2021, mild obstructive sleep apnea on CPAP in 2018,  COVID-19 infection in December 2020, she is now fully vaccinated.  Patient underwent left heart catheterization on 07/26/2020, which revealed revealed mid to distal LAD stenosis which is not high-grade, and medical therapy was recommended by Dr. Einar Gip.   Patient presents for 67-monthfollow-up of CAD and hyperlipidemia.  At last office visit patient made significant diet and lifestyle modifications medication changes were made.  Unfortunately patient fell and August 2022 and broke her left thumb, therefore she has not been active as much lately.  However she is still lost an additional 8 pounds since March.  Patient states she is feeling well from a cardiovascular standpoint. Denies palpitations, dyspnea, syncope, near syncope, dizziness, lightheadedness.  Denies leg swelling, orthopnea, PND.    She continues to tolerate guideline directed medical therapy including aspirin, losartan, metoprolol, and rosuvastatin.   Past Medical History:  Diagnosis Date   Asthma    Blockage of coronary artery of heart (HCC)    80%   Blood transfusion without reported diagnosis 1973   Fracture of ankle, left, closed 05/2015   History of colon polyps 01/2008   Dr. BIdolina Primerdue 2015   Hyperlipidemia    Hypertension    Prediabetes     Restless leg syndrome    Sleep apnea    Mild   Past Surgical History:  Procedure Laterality Date   ANKLE FRACTURE SURGERY Left 05/2015   BREAST SURGERY  09/2009   benign bx--fibrocystic changes   CATARACT EXTRACTION Right 05/02/2020   L5/S1 discectomy  2005   Left eye surgery for a retinal tear     LEFT HEART CATH AND CORONARY ANGIOGRAPHY N/A 07/26/2020   Procedure: LEFT HEART CATH AND CORONARY ANGIOGRAPHY;  Surgeon: GAdrian Prows MD;  [facility];  Service: Cardiovascular;  Laterality: N/A;   Family History  Problem Relation Age of Onset   Cardiomyopathy Sister 363      died of complications of cardiomyopathy   Hypertension Sister    Coronary artery disease Father 592      CABG   Hypertension Father    Heart attack Father    Colon cancer Paternal Grandmother    Diabetes Brother    Hypertension Brother    Cardiomyopathy Brother    Coronary artery disease Brother    COPD Brother    Hypertension Mother     Social History   Tobacco Use   Smoking status: Never   Smokeless tobacco: Never   Tobacco comments:    lives with a heavy smoker  Substance Use Topics   Alcohol use: Yes    Comment: 1-2 per month   Marital Status: Married  ROS  Review of Systems  Constitutional: Negative for malaise/fatigue and weight gain.  Cardiovascular:  Negative for chest pain, claudication, dyspnea on  exertion, leg swelling, near-syncope, orthopnea, palpitations, paroxysmal nocturnal dyspnea and syncope.  Respiratory:  Negative for shortness of breath.   Hematologic/Lymphatic: Does not bruise/bleed easily.  Gastrointestinal:  Negative for melena.  Neurological:  Negative for dizziness and weakness.  Objective  Blood pressure 122/68, pulse 62, temperature 98.4 F (36.9 C), temperature source Temporal, height 5' 4"  (1.626 m), weight 248 lb (112.5 kg), last menstrual period 06/25/2000, SpO2 97 %.  Vitals with BMI 03/23/2021 09/21/2020 08/19/2020  Height 5' 4"  5' 4"  5' 4.25"   Weight 248 lbs 256 lbs 262 lbs  BMI 42.55 34.19 37.90  Systolic 240 973 532  Diastolic 68 68 64  Pulse 62 51 84     Physical Exam Vitals reviewed.  Constitutional:      Appearance: She is obese.  Cardiovascular:     Rate and Rhythm: Normal rate and regular rhythm.     Pulses: Intact distal pulses.          Carotid pulses are  on the left side with bruit.    Heart sounds: Normal heart sounds, S1 normal and S2 normal. No murmur heard.   No gallop.     Comments: No JVD. Pulmonary:     Effort: Pulmonary effort is normal. No respiratory distress.     Breath sounds: Normal breath sounds. No wheezing, rhonchi or rales.  Musculoskeletal:     Right lower leg: No edema.     Left lower leg: No edema.  Skin:    General: Skin is warm and dry.  Neurological:     Mental Status: She is alert.   Laboratory examination:   Recent Labs    05/24/20 1445 07/20/20 1330  NA 141 140  K 3.9 3.5  CL 101 101  CO2 26 27  GLUCOSE 109* 105*  BUN 16 17  CREATININE 0.78 0.81  CALCIUM 9.2 9.1  GFRNONAA 79 76  GFRAA 92 88   CrCl cannot be calculated (Patient's most recent lab result is older than the maximum 21 days allowed.).  CMP Latest Ref Rng & Units 07/20/2020 05/24/2020  Glucose 65 - 99 mg/dL 105(H) 109(H)  BUN 8 - 27 mg/dL 17 16  Creatinine 0.57 - 1.00 mg/dL 0.81 0.78  Sodium 134 - 144 mmol/L 140 141  Potassium 3.5 - 5.2 mmol/L 3.5 3.9  Chloride 96 - 106 mmol/L 101 101  CO2 20 - 29 mmol/L 27 26  Calcium 8.7 - 10.3 mg/dL 9.1 9.2   CBC Latest Ref Rng & Units 07/20/2020  WBC 3.4 - 10.8 x10E3/uL 9.4  Hemoglobin 11.1 - 15.9 g/dL 13.5  Hematocrit 34.0 - 46.6 % 41.5  Platelets 150 - 450 x10E3/uL 252    Lipid Panel No results for input(s): CHOL, TRIG, LDLCALC, VLDL, HDL, CHOLHDL, LDLDIRECT in the last 8760 hours.  HEMOGLOBIN A1C No results found for: HGBA1C, MPG TSH No results for input(s): TSH in the last 8760 hours.  External labs:  07/23/2020: Total cholesterol 102, HDL 46,  triglycerides 106, LDL 37 Hgb 14, HCT 44, MCV 87, platelet 276 Sodium 140, potassium 3.6, BUN 14, creatinine 0.72, GFR >60 TSH 2.4 A1c 6.2%  07/13/2019: Total cholesterol 116, triglycerides 109, HDL 50, LDL 47.  Normal particle number and size. High sensitive CRP >10.0. Vitamin D 44.2. A1c 6.4%. BUN 17, creatinine 0.92, EGFR 65 mL, potassium 3.9, sodium 139, CMP normal. Hb 13.8/HCT 41.6, platelets 233.  Allergies   Allergies  Allergen Reactions   Shrimp [Shellfish Allergy] Hives and Swelling   Other  fluress eye drops   Oxycodone-Acetaminophen Nausea Only   Vicodin [Hydrocodone-Acetaminophen] Nausea Only           Medications Prior to Visit:   Outpatient Medications Prior to Visit  Medication Sig Dispense Refill   albuterol (PROVENTIL HFA;VENTOLIN HFA) 108 (90 BASE) MCG/ACT inhaler Inhale 2 puffs into the lungs every 6 (six) hours as needed.     ALPRAZolam (XANAX) 0.5 MG tablet Take 0.5 mg by mouth 2 (two) times daily as needed for sleep.     aspirin (ASPIRIN CHILDRENS) 81 MG chewable tablet Chew 1 tablet (81 mg total) by mouth daily. 180 tablet 2   Calcium Carbonate-Vitamin D 600-200 MG-UNIT CAPS Take 1 tablet by mouth daily.     chlorthalidone (HYGROTON) 25 MG tablet Take 25 mg by mouth daily.      Cholecalciferol 25 MCG (1000 UT) capsule Take 1,000 Units by mouth daily.     clobetasol ointment (TEMOVATE) 0.05 % Apple topically to affected area BID for no more than 7 days. 30 g 0   ezetimibe (ZETIA) 10 MG tablet Take 1 tablet (10 mg total) by mouth daily. 30 tablet 0   famciclovir (FAMVIR) 250 MG tablet Take 250 mg by mouth daily as needed (fever blisters).     fexofenadine (ALLEGRA) 180 MG tablet Take 180 mg by mouth daily as needed for allergies.      fish oil-omega-3 fatty acids 1000 MG capsule Take 1,400 mg by mouth daily.     FLOVENT HFA 110 MCG/ACT inhaler Inhale 2 puffs into the lungs 2 (two) times daily.     fluticasone (FLONASE) 50 MCG/ACT nasal spray Place 2  sprays into both nostrils daily as needed for allergies.      gabapentin (NEURONTIN) 600 MG tablet Take 300 mg by mouth daily.     ibuprofen (ADVIL,MOTRIN) 200 MG tablet Take 200-400 mg by mouth every 6 (six) hours as needed for mild pain or moderate pain.     isosorbide mononitrate (IMDUR) 30 MG 24 hr tablet TAKE ONE TABLET BY MOUTH ONE TIME DAILY 30 tablet 3   losartan (COZAAR) 100 MG tablet Take 1 tablet (100 mg total) by mouth every evening. (Patient taking differently: Take 100 mg by mouth at bedtime.) 90 tablet 3   metoprolol tartrate (LOPRESSOR) 25 MG tablet Take 1 tablet (25 mg total) by mouth 2 (two) times daily. 180 tablet 3   naproxen sodium (ANAPROX) 220 MG tablet Take 220 mg by mouth daily as needed (arthritis).      rosuvastatin (CRESTOR) 40 MG tablet Take 40 mg by mouth daily.     nitroGLYCERIN (NITROSTAT) 0.4 MG SL tablet Place 1 tablet (0.4 mg total) under the tongue every 5 (five) minutes as needed for chest pain. 30 tablet 3   No facility-administered medications prior to visit.   Final Medications at End of Visit    Current Meds  Medication Sig   albuterol (PROVENTIL HFA;VENTOLIN HFA) 108 (90 BASE) MCG/ACT inhaler Inhale 2 puffs into the lungs every 6 (six) hours as needed.   ALPRAZolam (XANAX) 0.5 MG tablet Take 0.5 mg by mouth 2 (two) times daily as needed for sleep.   aspirin (ASPIRIN CHILDRENS) 81 MG chewable tablet Chew 1 tablet (81 mg total) by mouth daily.   Calcium Carbonate-Vitamin D 600-200 MG-UNIT CAPS Take 1 tablet by mouth daily.   chlorthalidone (HYGROTON) 25 MG tablet Take 25 mg by mouth daily.    Cholecalciferol 25 MCG (1000 UT) capsule Take 1,000 Units by mouth  daily.   clobetasol ointment (TEMOVATE) 0.05 % Apple topically to affected area BID for no more than 7 days.   ezetimibe (ZETIA) 10 MG tablet Take 1 tablet (10 mg total) by mouth daily.   famciclovir (FAMVIR) 250 MG tablet Take 250 mg by mouth daily as needed (fever blisters).   fexofenadine  (ALLEGRA) 180 MG tablet Take 180 mg by mouth daily as needed for allergies.    fish oil-omega-3 fatty acids 1000 MG capsule Take 1,400 mg by mouth daily.   FLOVENT HFA 110 MCG/ACT inhaler Inhale 2 puffs into the lungs 2 (two) times daily.   fluticasone (FLONASE) 50 MCG/ACT nasal spray Place 2 sprays into both nostrils daily as needed for allergies.    gabapentin (NEURONTIN) 600 MG tablet Take 300 mg by mouth daily.   ibuprofen (ADVIL,MOTRIN) 200 MG tablet Take 200-400 mg by mouth every 6 (six) hours as needed for mild pain or moderate pain.   isosorbide mononitrate (IMDUR) 30 MG 24 hr tablet TAKE ONE TABLET BY MOUTH ONE TIME DAILY   losartan (COZAAR) 100 MG tablet Take 1 tablet (100 mg total) by mouth every evening. (Patient taking differently: Take 100 mg by mouth at bedtime.)   metoprolol tartrate (LOPRESSOR) 25 MG tablet Take 1 tablet (25 mg total) by mouth 2 (two) times daily.   naproxen sodium (ANAPROX) 220 MG tablet Take 220 mg by mouth daily as needed (arthritis).    rosuvastatin (CRESTOR) 40 MG tablet Take 40 mg by mouth daily.   Radiology:   No results found.  Cardiac Studies:   ABI 07/13/2019 done at PCP office: Bilateral ABI 1.08.  Echocardiogram 05/24/2020: Left ventricle cavity is normal in size. Mild concentric hypertrophy of the left ventricle. Normal global wall motion. Normal LV systolic function with EF 55%. Doppler evidence of grade I (impaired) diastolic dysfunction, normal LAP.  No significant valvular abnormality. Normal right atrial pressure.   Exercise Myoview stress test 05/30/2020: Exercise nuclear stress test was performed using Bruce protocol. Patient reached 6.3 METS, and 123% of age predicted maximum heart rate. Exercise capacity was low. Chest pain not reported. Exertional dyspnea reported. Heart rate and hemodynamic response were normal.  Stress EKG showed sinus tachycardia, low voltage,  2 mm horizontal/downsloping ST depressions in leads V4-V6, II, II, aVF,  normalizing within 1 min into revoery.  SPECT images showed decreased tracer uptake in inferior/infeoseptal myocardium, worse in rest images. While tissue attenuation is likely, ischemia in this region cannot be excluded. Stress LVEF 71%. Intermediate risk study.  Compared to 03/06/2016 report of Lexiscan stress test, no ST-T wave changes and normal perfusion.  Coronary calcium score 06/02/2020: Total calcium score 1442. Left main: 38 LAD 328 CX 142 RCA 933. Total calcium score of 1442 is between 90th and 100 percentile for females age between 63-69 years.  Implies extensive atherosclerotic disease and risk of CAD and high likelihood of at least one significant coronary narrowing.  Left Heart Catheterization 07/26/20:  LV: Normal LV systolic function, EF 55 to 60%.  Normal LVEDP.  No pressure gradient across the aortic valve. Left main: Large vessel, minimal calcification evident. LAD: Large caliber vessel.  Gives origin to a small D1 and then trifurcates into a large SP2, large D2 which is LAD equivalent and a small to moderate-sized distal LAD.  At the trifurcation, there is a mildly calcific 80% stenosis and ostial D2 has a 40% stenosis.  Mild calcification of proximal LAD. CX: Large vessel, gives origin to large OM1 and OM 2,  after origin of OM 2, mid circumflex to distal circumflex has a focal 40% stenosis. RCA: Large caliber vessel, giving origin to a very large PL branch and PDA branch, supplying the apex of the left ventricle as well. Recommendation: Patient's symptoms are related to LAD stenosis.  However as this is a mid to distal LAD stenosis, not life-threatening, and not high-grade, I would like to try medical therapy first.  At least she is now restratified and there is no ostial stenosis of major vessels.  She also appears motivated in making lifestyle changes.  90 mL contrast utilized.   EKG:  03/23/2021: Sinus rhythm rate 61 bpm.  Left atrial margin.  Normal axis.  Low-voltage  complexes.  No evidence of underlying ischemia or injury pattern.  05/16/2020: Normal sinus rhythm at rate of 64 bpm, normal axis.  No evidence of ischemia, normal EKG.     Assessment     ICD-10-CM   1. Coronary artery disease of native artery of native heart with stable angina pectoris ([place])  I25.118 EKG 12-Lead    2. Primary hypertension  I10     3. Hypercholesteremia  E78.00       There are no discontinued medications.  No orders of the defined types were placed in this encounter.  Orders Placed This Encounter  Procedures   EKG 12-Lead    Recommendations:   Tracy Cabrera is a 65 y.o. Caucasian female with a strong family history of premature coronary disease, family history of "cardiomyopathy" in her sister who died at the age of 76 years in 1995, details not available, no other family members with sudden cardiac death or recurrent syncope. Patient has history of hypertension, prediabetes and hyperlipidemia. She has had sensorineural hearing loss diagnosed July 2021, mild obstructive sleep apnea on CPAP in 2018,  COVID-19 infection in December 2020, she is now fully vaccinated.   Patient underwent left heart catheterization on 07/26/2020, which revealed revealed mid to distal LAD stenosis which is not high-grade, and medical therapy was recommended by Dr. Einar Gip.   Patient presents for 32-monthfollow-up of CAD and hyperlipidemia.  At last office visit patient made significant diet and lifestyle modifications medication changes were made.  Patient remained stable from a cardiovascular standpoint.  Blood pressure is well controlled.  I personally reviewed external labs, lipids are under excellent control.  She is tolerating guideline directed medical therapy for CAD without issue.  She remains relatively asymptomatic and continues to focus on diet and lifestyle modifications.  Encouraged her to continue to work on weight loss.  She is otherwise stable from a cardiovascular  standpoint.  Follow-up in 1 year, sooner if needed, for CAD, hypertension, hyperlipidemia.   CAlethia Berthold PA-C 03/23/2021, 1:25 PM Office: [phone]

## 2021-03-21 NOTE — Telephone Encounter (Signed)
Spoke with patient but she was not in a position to speak with me. She said she will call back later this afternoon.

## 2021-03-21 NOTE — Telephone Encounter (Signed)
Patient informed with below note, she reports she has LC in 2 areas, vagina and rectal and she only uses a pea size amount only and not every week. Patient said she had her last tube left and she only asked for a refills because her annual exam is not until Feb and she does not want to run out.

## 2021-03-23 ENCOUNTER — Ambulatory Visit: Payer: PPO | Admitting: Student

## 2021-03-23 ENCOUNTER — Other Ambulatory Visit: Payer: Self-pay

## 2021-03-23 ENCOUNTER — Encounter: Payer: Self-pay | Admitting: Student

## 2021-03-23 VITALS — BP 122/68 | HR 62 | Temp 98.4°F | Ht 64.0 in | Wt 248.0 lb

## 2021-03-23 DIAGNOSIS — I10 Essential (primary) hypertension: Secondary | ICD-10-CM | POA: Diagnosis not present

## 2021-03-23 DIAGNOSIS — I25118 Atherosclerotic heart disease of native coronary artery with other forms of angina pectoris: Secondary | ICD-10-CM | POA: Diagnosis not present

## 2021-03-23 DIAGNOSIS — E7800 Pure hypercholesterolemia, unspecified: Secondary | ICD-10-CM

## 2021-03-24 ENCOUNTER — Ambulatory Visit: Payer: PPO | Admitting: Student

## 2021-04-04 ENCOUNTER — Other Ambulatory Visit: Payer: Self-pay | Admitting: Student

## 2021-04-18 DIAGNOSIS — S62242A Displaced fracture of shaft of first metacarpal bone, left hand, initial encounter for closed fracture: Secondary | ICD-10-CM | POA: Diagnosis not present

## 2021-05-10 ENCOUNTER — Other Ambulatory Visit: Payer: Self-pay | Admitting: Student

## 2021-05-16 DIAGNOSIS — S62242D Displaced fracture of shaft of first metacarpal bone, left hand, subsequent encounter for fracture with routine healing: Secondary | ICD-10-CM | POA: Diagnosis not present

## 2021-08-01 ENCOUNTER — Encounter: Payer: Self-pay | Admitting: Nurse Practitioner

## 2021-08-22 ENCOUNTER — Ambulatory Visit: Payer: PPO | Admitting: Nurse Practitioner

## 2021-08-24 ENCOUNTER — Encounter: Payer: Self-pay | Admitting: Nurse Practitioner

## 2021-08-24 ENCOUNTER — Other Ambulatory Visit: Payer: Self-pay

## 2021-08-24 ENCOUNTER — Ambulatory Visit (INDEPENDENT_AMBULATORY_CARE_PROVIDER_SITE_OTHER): Payer: PPO | Admitting: Nurse Practitioner

## 2021-08-24 ENCOUNTER — Other Ambulatory Visit (HOSPITAL_COMMUNITY)
Admission: RE | Admit: 2021-08-24 | Discharge: 2021-08-24 | Disposition: A | Discharge status: Home / Self Care | Payer: PPO | Source: Ambulatory Visit | Attending: Nurse Practitioner | Admitting: Nurse Practitioner

## 2021-08-24 VITALS — BP 122/80 | Ht 64.0 in | Wt 255.0 lb

## 2021-08-24 DIAGNOSIS — Z1151 Encounter for screening for human papillomavirus (HPV): Secondary | ICD-10-CM | POA: Insufficient documentation

## 2021-08-24 DIAGNOSIS — Z01419 Encounter for gynecological examination (general) (routine) without abnormal findings: Secondary | ICD-10-CM | POA: Diagnosis present

## 2021-08-24 DIAGNOSIS — Z780 Asymptomatic menopausal state: Secondary | ICD-10-CM | POA: Diagnosis not present

## 2021-08-24 DIAGNOSIS — L900 Lichen sclerosus et atrophicus: Secondary | ICD-10-CM | POA: Diagnosis not present

## 2021-08-24 DIAGNOSIS — M8589 Other specified disorders of bone density and structure, multiple sites: Secondary | ICD-10-CM | POA: Diagnosis not present

## 2021-08-24 MED ORDER — CLOBETASOL PROPIONATE 0.05 % EX OINT: TOPICAL_OINTMENT | CUTANEOUS | 1 refills | Status: DC

## 2021-08-24 NOTE — Addendum Note (Signed)
Addended byMarny Lowenstein on: 08/24/2021 11:20 AM ? ? Modules accepted: Level of Service ? ?

## 2021-08-24 NOTE — Progress Notes (Signed)
? ?Tracy Cabrera Cabrera 1956-04-27 [id number] ? ? ?History:  66 y.o. G2P2002 presents for breast and pelvic exam. Postmenopausal - no HRT, no bleeding. Normal pap history. HTN, HLD, pre-diabetes managed by PCP. Lichen sclerosus managed by Clobetasol ointment, uses it every 2-3 weeks as needed.  ? ?Gynecologic History ?Patient's last menstrual period was 06/25/2000. ?  ?Contraception: post menopausal status ?Sexually active: Yes ? ?Health Maintenance ?Last Pap: 11/30/2016. Results were: Normal ?Last mammogram: 08/01/2021. Results were: bilateral benign calcifications ?Last colonoscopy: 05/27/2019. Results were: Tubular adenomas, 3-year recall ?Last Dexa: 12/02/2018. Results were: T-score -1.3, FRAX 12% / 1.0% ? ?Past medical history, past surgical history, family history and social history were all reviewed and documented in the EPIC chart. Married. CPA. Daughter is deceased, son lives in [place]. Raising great nephews ages 64 and 87. Mother has osteoporosis.  ? ?ROS:  A ROS was performed and pertinent positives and negatives are included. ? ?Exam: ? ?Vitals:  ? 08/24/21 1033  ?BP: 122/80  ?Weight: 255 lb (115.7 kg)  ?Height: 5\' 4"  (1.626 m)  ? ?Body mass index is 43.77 kg/m?. ? ?General appearance:  Normal ?Thyroid:  Symmetrical, normal in size, without palpable masses or nodularity. ?Respiratory ? Auscultation:  Clear without wheezing or rhonchi ?Cardiovascular ? Auscultation:  Regular rate, without rubs, murmurs or gallops ? Edema/varicosities:  Not grossly evident ?Abdominal ? Soft,nontender, without masses, guarding or rebound. ? Liver/spleen:  No organomegaly noted ? Hernia:  None appreciated ? Skin ? Inspection:  Grossly normal ?Breasts: Examined lying and sitting.  ? Right: Without masses, retractions, nipple discharge or axillary adenopathy. ? ? Left: Without masses, retractions, nipple discharge or axillary adenopathy. ?Genitourinary  ? Inguinal/mons:  Normal without inguinal adenopathy ? External genitalia:   Normal appearing vulva with no masses, tenderness, or lesions ? BUS/Urethra/Skene's glands:  Normal ? Vagina:  Normal appearing with normal color and discharge, no lesions ? Cervix:  Normal appearing without discharge or lesions ? Uterus:  Difficult to palpate due to body habitus but no gross masses or tenderness ? Adnexa/parametria:   ?  Rt: Normal in size, without masses or tenderness. ?  Lt: Normal in size, without masses or tenderness. ? Anus and perineum: Normal ? Digital rectal exam: Normal sphincter tone without palpated masses or tenderness ? ?Patient informed chaperone available to be present for breast and pelvic exam. Patient has requested no chaperone to be present. Patient has been advised what will be completed during breast and pelvic exam.  ? ?Assessment/Plan:  66 y.o. [id number] for breast and pelvic exam.  ? ?Well female exam with routine gynecological exam - Plan: Cytology - PAP( ). Education provided on SBEs, importance of preventative screenings, current guidelines, high calcium diet, regular exercise, and multivitamin daily. Labs with PCP.  ? ?Lichen sclerosus - Plan: clobetasol ointment (TEMOVATE) 0.05 % as needed. Using every 2-3 weeks for a couple of days with good management.  ? ?Postmenopausal - Plan: DG Bone Density. No HRT, no bleeding ? ?Osteopenia of multiple sites - Plan: DG Bone Density. T-score -1.3 without elevated FRAX in 11/2018. Continue Vitamin D + Calcium and increase exercise. She uses stationary bike and walks some. She is limited due to back and ankle pain. Will repeat DXA now. Mother has osteoporosis.  ? ?Screening for cervical cancer - Normal Pap history.  Pap today since last was prior to age 15. Discussed if normal we can stop screening per guidelines and she is agreeable.  ? ?Screening for breast cancer - Benign  bilateral breast calcifications on most recent mammogram 08/01/2021. Continue annual screenings.  Normal breast exam today. ? ?Screening for colon cancer  - 2020 colonoscopy. Will repeat this year at [facility] recommendation.  ? ?Return in 1 year for medication follow up (lichen sclerosus).  ? ? ? ?[place], 11:12 AM 08/24/2021 ? ?

## 2021-08-25 ENCOUNTER — Telehealth: Payer: Self-pay | Admitting: *Deleted

## 2021-08-25 DIAGNOSIS — L900 Lichen sclerosus et atrophicus: Secondary | ICD-10-CM

## 2021-08-25 LAB — CYTOLOGY - PAP
Comment: NEGATIVE
Diagnosis: NEGATIVE
High risk HPV: NEGATIVE

## 2021-08-25 NOTE — Telephone Encounter (Signed)
[facility] sent a fax stating " per new regulations for insurance audits we are now required to document a day supply for all topical preparations. Please let us know how long you expect this quantity of medication to last."  ? ?This is for the clobetasol ointment 0.05% ointment (they need the day supply)   ? ?Please advise  ?

## 2021-08-28 MED ORDER — CLOBETASOL PROPIONATE 0.05 % EX OINT: TOPICAL_OINTMENT | CUTANEOUS | 1 refills | Status: DC

## 2021-08-28 NOTE — Telephone Encounter (Signed)
Pharmacy calling to confirm how long you expect one 30gram tube to last for pt, im assuming if she were to use it continually twice weekly without stopping. How long would you expect one tube to last? Please advise. Thanks.  ?

## 2021-08-28 NOTE — Telephone Encounter (Signed)
I have Rx pending so a new Rx can be sent with new directions/quantity.   ?

## 2021-08-28 NOTE — Addendum Note (Signed)
Addended byMarny Lowenstein on: 08/28/2021 09:23 AM ? ? Modules accepted: Orders ? ?

## 2021-08-28 NOTE — Telephone Encounter (Signed)
3 months

## 2021-08-28 NOTE — Addendum Note (Signed)
Addended by: Thamas Jaegers on: 08/28/2021 08:56 AM ? ? Modules accepted: Orders ? ?

## 2021-08-28 NOTE — Telephone Encounter (Signed)
She will use it twice weekly for duration of use since she has a chronic condition. We can put 365 days if needed.  ?

## 2021-08-29 NOTE — Telephone Encounter (Signed)
Pharmacy notified and voiced understanding.  ?

## 2021-10-15 ENCOUNTER — Other Ambulatory Visit: Payer: Self-pay | Admitting: Student

## 2021-10-15 DIAGNOSIS — I25118 Atherosclerotic heart disease of native coronary artery with other forms of angina pectoris: Secondary | ICD-10-CM

## 2021-10-17 ENCOUNTER — Ambulatory Visit (INDEPENDENT_AMBULATORY_CARE_PROVIDER_SITE_OTHER): Payer: PPO

## 2021-10-17 ENCOUNTER — Other Ambulatory Visit: Payer: Self-pay | Admitting: Nurse Practitioner

## 2021-10-17 DIAGNOSIS — Z780 Asymptomatic menopausal state: Secondary | ICD-10-CM

## 2021-10-17 DIAGNOSIS — M8589 Other specified disorders of bone density and structure, multiple sites: Secondary | ICD-10-CM

## 2021-10-24 ENCOUNTER — Other Ambulatory Visit: Payer: Self-pay | Admitting: Family Medicine

## 2021-10-24 ENCOUNTER — Ambulatory Visit
Admission: RE | Admit: 2021-10-24 | Discharge: 2021-10-24 | Disposition: A | Discharge status: Home / Self Care | Payer: PPO | Source: Ambulatory Visit | Attending: Family Medicine | Admitting: Family Medicine

## 2021-10-24 DIAGNOSIS — R948 Abnormal results of function studies of other organs and systems: Secondary | ICD-10-CM

## 2021-10-24 DIAGNOSIS — R930 Abnormal findings on diagnostic imaging of skull and head, not elsewhere classified: Secondary | ICD-10-CM

## 2022-01-10 ENCOUNTER — Other Ambulatory Visit: Payer: Self-pay | Admitting: Student

## 2022-01-10 DIAGNOSIS — I25118 Atherosclerotic heart disease of native coronary artery with other forms of angina pectoris: Secondary | ICD-10-CM

## 2022-03-23 ENCOUNTER — Ambulatory Visit: Payer: PPO | Admitting: Internal Medicine

## 2022-04-06 ENCOUNTER — Other Ambulatory Visit: Payer: Self-pay

## 2022-04-06 ENCOUNTER — Encounter: Payer: Self-pay | Admitting: Internal Medicine

## 2022-04-06 DIAGNOSIS — I25118 Atherosclerotic heart disease of native coronary artery with other forms of angina pectoris: Secondary | ICD-10-CM

## 2022-04-06 MED ORDER — METOPROLOL TARTRATE 25 MG PO TABS: 25.0000 mg | ORAL_TABLET | Freq: Two times a day (BID) | ORAL | 0 refills | Status: DC

## 2022-04-10 ENCOUNTER — Ambulatory Visit: Payer: PPO | Admitting: Internal Medicine

## 2022-04-23 ENCOUNTER — Ambulatory Visit: Payer: PPO | Admitting: Internal Medicine

## 2022-04-26 ENCOUNTER — Ambulatory Visit: Payer: PPO | Admitting: Internal Medicine

## 2022-04-26 ENCOUNTER — Encounter: Payer: Self-pay | Admitting: Internal Medicine

## 2022-04-26 VITALS — BP 117/79 | HR 62 | Temp 97.9°F | Resp 16 | Ht 64.0 in | Wt 256.0 lb

## 2022-04-26 DIAGNOSIS — I10 Essential (primary) hypertension: Secondary | ICD-10-CM

## 2022-04-26 DIAGNOSIS — E782 Mixed hyperlipidemia: Secondary | ICD-10-CM

## 2022-04-26 DIAGNOSIS — I25118 Atherosclerotic heart disease of native coronary artery with other forms of angina pectoris: Secondary | ICD-10-CM

## 2022-04-26 NOTE — Progress Notes (Signed)
Primary Physician/Referring:  Derinda Late, MD  Patient ID: Tracy Cabrera, female    DOB: 1955-09-13, 66 y.o.   MRN: [id number]  No chief complaint on file.  HPI:    Tracy Cabrera  is a 66 y.o. Caucasian female with a strong family history of premature coronary disease, family history of "cardiomyopathy" in her sister who died at the age of 67 years in 1995, details not available, no other family members with sudden cardiac death or recurrent syncope. Patient has history of hypertension, prediabetes and hyperlipidemia. She has had sensorineural hearing loss diagnosed July 2021, mild obstructive sleep apnea on CPAP in 2018,  COVID-19 infection in December 2020, she is now fully vaccinated.  Patient underwent left heart catheterization on 07/26/2020, which revealed revealed mid to distal LAD stenosis which is not high-grade, and medical therapy was recommended by Dr. Einar Gip.   Patient is here for follow-up visit today.  She has been doing well since our last visit.  She is having some knee pain so she has not been riding her bike as much lately.  Otherwise she has not had any anginal symptoms whatsoever with activity.  She is still gardening and doing everything around the house.  Patient denies chest pain, shortness of breath, palpitations, diaphoresis, syncope, claudication, PND, orthopnea.  Past Medical History:  Diagnosis Date   Asthma    Blockage of coronary artery of heart (HCC)    80%   Blood transfusion without reported diagnosis 1973   Fracture of ankle, left, closed 05/2015   History of colon polyps 01/2008   Dr. Idolina Primer due 2015   Hyperlipidemia    Hypertension    Prediabetes    Restless leg syndrome    Sleep apnea    Mild   Past Surgical History:  Procedure Laterality Date   ANKLE FRACTURE SURGERY Left 05/2015   BREAST SURGERY  09/2009   benign bx--fibrocystic changes   CATARACT EXTRACTION Right 05/02/2020   L5/S1 discectomy  2005   Left eye surgery for a  retinal tear     LEFT HEART CATH AND CORONARY ANGIOGRAPHY N/A 07/26/2020   Procedure: LEFT HEART CATH AND CORONARY ANGIOGRAPHY;  Surgeon: Adrian Prows, MD;  Location: [facility];  Service: Cardiovascular;  Laterality: N/A;   Family History  Problem Relation Age of Onset   Hypertension Mother    Coronary artery disease Father 10       CABG   Hypertension Father    Heart attack Father    Cardiomyopathy Sister 51       died of complications of cardiomyopathy   Hypertension Sister    Diabetes Brother    Hypertension Brother    Cardiomyopathy Brother    Coronary artery disease Brother    COPD Brother    Colon cancer Paternal Grandmother     Social History   Tobacco Use   Smoking status: Never   Smokeless tobacco: Never   Tobacco comments:    lives with a heavy smoker  Substance Use Topics   Alcohol use: Yes    Comment: 1-2 per month   Marital Status: Married  ROS  Review of Systems  Constitutional: Negative for malaise/fatigue and weight gain.  Cardiovascular:  Negative for chest pain, claudication, dyspnea on exertion, leg swelling, near-syncope, orthopnea, palpitations, paroxysmal nocturnal dyspnea and syncope.  Respiratory:  Negative for shortness of breath.   Hematologic/Lymphatic: Does not bruise/bleed easily.  Gastrointestinal:  Negative for melena.  Neurological:  Negative for dizziness and  weakness.   Objective  Blood pressure 117/79, pulse 62, temperature 97.9 F (36.6 C), temperature source Temporal, resp. rate 16, height _0  (1.626 m), weight 256 lb (116.1 kg), last menstrual period 06/25/2000, SpO2 96 %.     04/26/2022   11:05 AM 08/24/2021   10:33 AM 03/23/2021    9:38 AM  Vitals with BMI  Height _1  _2  _3   Weight 256 lbs 255 lbs 248 lbs  BMI 43.92 54.62 70.35  Systolic 009 381 829  Diastolic 79 80 68  Pulse 62  62     Physical Exam Vitals reviewed.  Constitutional:      Appearance: She is obese.  Cardiovascular:     Rate and Rhythm:  Normal rate and regular rhythm.     Pulses: Intact distal pulses.          Carotid pulses are  on the left side with bruit.    Heart sounds: Normal heart sounds, S1 normal and S2 normal. No murmur heard.    No gallop.     Comments: No JVD. Pulmonary:     Effort: Pulmonary effort is normal. No respiratory distress.     Breath sounds: Normal breath sounds. No wheezing, rhonchi or rales.  Musculoskeletal:     Right lower leg: No edema.     Left lower leg: No edema.  Skin:    General: Skin is warm and dry.  Neurological:     Mental Status: She is alert.   Laboratory examination:   No results for input(s): "NA", "K", "CL", "CO2", "GLUCOSE", "BUN", "CREATININE", "CALCIUM", "GFRNONAA", "GFRAA" in the last 8760 hours.  CrCl cannot be calculated (Patient's most recent lab result is older than the maximum 21 days allowed.).     Latest Ref Rng & Units 07/20/2020    1:30 PM 05/24/2020    2:45 PM  CMP  Glucose 65 - 99 mg/dL 105  109   BUN 8 - 27 mg/dL 17  16   Creatinine 0.57 - 1.00 mg/dL 0.81  0.78   Sodium 134 - 144 mmol/L 140  141   Potassium 3.5 - 5.2 mmol/L 3.5  3.9   Chloride 96 - 106 mmol/L 101  101   CO2 20 - 29 mmol/L 27  26   Calcium 8.7 - 10.3 mg/dL 9.1  9.2       Latest Ref Rng & Units 07/20/2020    1:30 PM  CBC  WBC 3.4 - 10.8 x10E3/uL 9.4   Hemoglobin 11.1 - 15.9 g/dL 13.5   Hematocrit 34.0 - 46.6 % 41.5   Platelets 150 - 450 x10E3/uL 252     Lipid Panel No results for input(s): "CHOL", "TRIG", "[place]", "VLDL", "HDL", "CHOLHDL", "LDLDIRECT" in the last 8760 hours.  HEMOGLOBIN A1C No results found for: "HGBA1C", "MPG" TSH No results for input(s): "TSH" in the last 8760 hours.  External labs:  07/23/2020: Total cholesterol 102, HDL 46, triglycerides 106, LDL 37 Hgb 14, HCT 44, MCV 87, platelet 276 Sodium 140, potassium 3.6, BUN 14, creatinine 0.72, GFR >60 TSH 2.4 A1c 6.2%  07/13/2019: Total cholesterol 116, triglycerides 109, HDL 50, LDL 47.  Normal  particle number and size. High sensitive CRP >10.0. Vitamin D 44.2. A1c 6.4%. BUN 17, creatinine 0.92, EGFR 65 mL, potassium 3.9, sodium 139, CMP normal. Hb 13.8/HCT 41.6, platelets 233.  Allergies   Allergies  Allergen Reactions   Shrimp [Shellfish Allergy] Hives and Swelling   Other     fluress eye drops  Oxycodone-Acetaminophen Nausea Only   Vicodin [Hydrocodone-Acetaminophen] Nausea Only           Medications Prior to Visit:   Outpatient Medications Prior to Visit  Medication Sig Dispense Refill   albuterol (PROVENTIL HFA;VENTOLIN HFA) 108 (90 BASE) MCG/ACT inhaler Inhale 2 puffs into the lungs every 6 (six) hours as needed.     ALPRAZolam (XANAX) 0.5 MG tablet Take 0.5 mg by mouth 2 (two) times daily as needed for sleep.     aspirin (ASPIRIN CHILDRENS) 81 MG chewable tablet Chew 1 tablet (81 mg total) by mouth daily. 180 tablet 2   Calcium Carbonate-Vitamin D 600-200 MG-UNIT CAPS Take 1 tablet by mouth daily.     chlorthalidone (HYGROTON) 25 MG tablet Take 25 mg by mouth daily.      Cholecalciferol 25 MCG (1000 UT) capsule Take 1,000 Units by mouth daily.     clobetasol ointment (TEMOVATE) 0.05 % Apply topically 2 (two) times a week. 30 g 1   ezetimibe (ZETIA) 10 MG tablet Take 1 tablet (10 mg total) by mouth daily. 30 tablet 0   fexofenadine (ALLEGRA) 180 MG tablet Take 180 mg by mouth daily as needed for allergies.      fish oil-omega-3 fatty acids 1000 MG capsule Take 1,400 mg by mouth daily.     FLOVENT HFA 110 MCG/ACT inhaler Inhale 2 puffs into the lungs 2 (two) times daily.     fluticasone (FLONASE) 50 MCG/ACT nasal spray Place 2 sprays into both nostrils daily as needed for allergies.      gabapentin (NEURONTIN) 600 MG tablet Take 300 mg by mouth daily.     ibuprofen (ADVIL,MOTRIN) 200 MG tablet Take 200-400 mg by mouth every 6 (six) hours as needed for mild pain or moderate pain.     isosorbide mononitrate (IMDUR) 30 MG 24 hr tablet TAKE ONE TABLET BY MOUTH ONE  TIME DAILY 90 tablet 3   losartan (COZAAR) 100 MG tablet Take 1 tablet (100 mg total) by mouth every evening. (Patient taking differently: Take 100 mg by mouth at bedtime.) 90 tablet 3   metoprolol tartrate (LOPRESSOR) 25 MG tablet Take 1 tablet (25 mg total) by mouth 2 (two) times daily. 60 tablet 0   naproxen sodium (ANAPROX) 220 MG tablet Take 220 mg by mouth daily as needed (arthritis).      nitroGLYCERIN (NITROSTAT) 0.4 MG SL tablet Place 1 tablet (0.4 mg total) under the tongue every 5 (five) minutes as needed for chest pain. 30 tablet 3   rosuvastatin (CRESTOR) 40 MG tablet Take 40 mg by mouth daily.     famciclovir (FAMVIR) 250 MG tablet Take 250 mg by mouth daily as needed (fever blisters). (Patient not taking: Reported on 04/26/2022)     No facility-administered medications prior to visit.   Final Medications at End of Visit    Current Meds  Medication Sig   albuterol (PROVENTIL HFA;VENTOLIN HFA) 108 (90 BASE) MCG/ACT inhaler Inhale 2 puffs into the lungs every 6 (six) hours as needed.   ALPRAZolam (XANAX) 0.5 MG tablet Take 0.5 mg by mouth 2 (two) times daily as needed for sleep.   aspirin (ASPIRIN CHILDRENS) 81 MG chewable tablet Chew 1 tablet (81 mg total) by mouth daily.   Calcium Carbonate-Vitamin D 600-200 MG-UNIT CAPS Take 1 tablet by mouth daily.   chlorthalidone (HYGROTON) 25 MG tablet Take 25 mg by mouth daily.    Cholecalciferol 25 MCG (1000 UT) capsule Take 1,000 Units by mouth daily.   clobetasol  ointment (TEMOVATE) 0.05 % Apply topically 2 (two) times a week.   ezetimibe (ZETIA) 10 MG tablet Take 1 tablet (10 mg total) by mouth daily.   fexofenadine (ALLEGRA) 180 MG tablet Take 180 mg by mouth daily as needed for allergies.    fish oil-omega-3 fatty acids 1000 MG capsule Take 1,400 mg by mouth daily.   FLOVENT HFA 110 MCG/ACT inhaler Inhale 2 puffs into the lungs 2 (two) times daily.   fluticasone (FLONASE) 50 MCG/ACT nasal spray Place 2 sprays into both nostrils daily  as needed for allergies.    gabapentin (NEURONTIN) 600 MG tablet Take 300 mg by mouth daily.   ibuprofen (ADVIL,MOTRIN) 200 MG tablet Take 200-400 mg by mouth every 6 (six) hours as needed for mild pain or moderate pain.   isosorbide mononitrate (IMDUR) 30 MG 24 hr tablet TAKE ONE TABLET BY MOUTH ONE TIME DAILY   losartan (COZAAR) 100 MG tablet Take 1 tablet (100 mg total) by mouth every evening. (Patient taking differently: Take 100 mg by mouth at bedtime.)   metoprolol tartrate (LOPRESSOR) 25 MG tablet Take 1 tablet (25 mg total) by mouth 2 (two) times daily.   naproxen sodium (ANAPROX) 220 MG tablet Take 220 mg by mouth daily as needed (arthritis).    nitroGLYCERIN (NITROSTAT) 0.4 MG SL tablet Place 1 tablet (0.4 mg total) under the tongue every 5 (five) minutes as needed for chest pain.   rosuvastatin (CRESTOR) 40 MG tablet Take 40 mg by mouth daily.   Radiology:   No results found.  Cardiac Studies:   ABI 07/13/2019 done at PCP office: Bilateral ABI 1.08.  Echocardiogram 05/24/2020: Left ventricle cavity is normal in size. Mild concentric hypertrophy of the left ventricle. Normal global wall motion. Normal LV systolic function with EF 55%. Doppler evidence of grade I (impaired) diastolic dysfunction, normal LAP.  No significant valvular abnormality. Normal right atrial pressure.   Exercise Myoview stress test 05/30/2020: Exercise nuclear stress test was performed using Bruce protocol. Patient reached 6.3 METS, and 123% of age predicted maximum heart rate. Exercise capacity was low. Chest pain not reported. Exertional dyspnea reported. Heart rate and hemodynamic response were normal.  Stress EKG showed sinus tachycardia, low voltage,  2 mm horizontal/downsloping ST depressions in leads V4-V6, II, II, aVF, normalizing within 1 min into revoery.  SPECT images showed decreased tracer uptake in inferior/infeoseptal myocardium, worse in rest images. While tissue attenuation is likely, ischemia  in this region cannot be excluded. Stress LVEF 71%. Intermediate risk study.  Compared to 03/06/2016 report of Lexiscan stress test, no ST-T wave changes and normal perfusion.  Coronary calcium score 06/02/2020: Total calcium score 1442. Left main: 38 LAD 328 CX 142 RCA 933. Total calcium score of 1442 is between 90th and 100 percentile for females age between 61-69 years.  Implies extensive atherosclerotic disease and risk of CAD and high likelihood of at least one significant coronary narrowing.  Left Heart Catheterization 07/26/20:  LV: Normal LV systolic function, EF 55 to 60%.  Normal LVEDP.  No pressure gradient across the aortic valve. Left main: Large vessel, minimal calcification evident. LAD: Large caliber vessel.  Gives origin to a small D1 and then trifurcates into a large SP2, large D2 which is LAD equivalent and a small to moderate-sized distal LAD.  At the trifurcation, there is a mildly calcific 80% stenosis and ostial D2 has a 40% stenosis.  Mild calcification of proximal LAD. CX: Large vessel, gives origin to large OM1 and OM 2,  after origin of OM 2, mid circumflex to distal circumflex has a focal 40% stenosis. RCA: Large caliber vessel, giving origin to a very large PL branch and PDA branch, supplying the apex of the left ventricle as well. Recommendation: Patient's symptoms are related to LAD stenosis.  However as this is a mid to distal LAD stenosis, not life-threatening, and not high-grade, I would like to try medical therapy first.  At least she is now restratified and there is no ostial stenosis of major vessels.  She also appears motivated in making lifestyle changes.  90 mL contrast utilized.   EKG:  04/26/2022 sinus bradycardia, heart rate 57, normal axis, low voltage, no evidence of ischemia  03/23/2021: Sinus rhythm rate 61 bpm.  Left atrial margin.  Normal axis.  Low-voltage complexes.  No evidence of underlying ischemia or injury pattern.  05/16/2020: Normal sinus  rhythm at rate of 64 bpm, normal axis.  No evidence of ischemia, normal EKG.     Assessment     ICD-10-CM   1. Coronary artery disease of native artery of native heart with stable angina pectoris ([place])  I25.118 EKG 12-Lead    2. Primary hypertension  I10       There are no discontinued medications.  No orders of the defined types were placed in this encounter.  Orders Placed This Encounter  Procedures   EKG 12-Lead    Recommendations:   Tracy Cabrera is a 66 y.o. Caucasian female with a strong family history of premature coronary disease, family history of "cardiomyopathy" in her sister who died at the age of 13 years in 1995, details not available, no other family members with sudden cardiac death or recurrent syncope. Patient has history of hypertension, prediabetes and hyperlipidemia. She has had sensorineural hearing loss diagnosed July 2021, mild obstructive sleep apnea on CPAP in 2018,  COVID-19 infection in December 2020, she is now fully vaccinated.   Coronary artery disease of native artery of native heart with stable angina pectoris [facility]) Continue current cardiac medications. No need to repeat stress test given absence of symptoms   Primary hypertension Continue current cardiac medications. Encourage low-sodium diet, less than 2000 mg daily.   Hyperlipidemia Continue statin   Follow-up in 1 year, sooner if needed, for CAD, hypertension, hyperlipidemia.     Floydene Flock, PA-C 04/26/2022, 11:15 AM Office: [phone]

## 2022-05-09 ENCOUNTER — Other Ambulatory Visit: Payer: Self-pay | Admitting: Internal Medicine

## 2022-05-09 DIAGNOSIS — I25118 Atherosclerotic heart disease of native coronary artery with other forms of angina pectoris: Secondary | ICD-10-CM

## 2022-05-15 ENCOUNTER — Encounter: Payer: Self-pay | Admitting: Internal Medicine

## 2022-05-16 ENCOUNTER — Other Ambulatory Visit: Payer: Self-pay

## 2022-05-16 MED ORDER — ISOSORBIDE MONONITRATE ER 30 MG PO TB24: 30.0000 mg | ORAL_TABLET | Freq: Every day | ORAL | 3 refills | Status: DC

## 2022-06-19 ENCOUNTER — Other Ambulatory Visit: Payer: Self-pay | Admitting: Internal Medicine

## 2022-06-19 DIAGNOSIS — I25118 Atherosclerotic heart disease of native coronary artery with other forms of angina pectoris: Secondary | ICD-10-CM

## 2022-06-20 MED ORDER — METOPROLOL TARTRATE 25 MG PO TABS: 25.0000 mg | ORAL_TABLET | Freq: Two times a day (BID) | ORAL | 0 refills | Status: DC

## 2022-06-27 ENCOUNTER — Ambulatory Visit (INDEPENDENT_AMBULATORY_CARE_PROVIDER_SITE_OTHER): Payer: PPO

## 2022-06-27 ENCOUNTER — Ambulatory Visit (INDEPENDENT_AMBULATORY_CARE_PROVIDER_SITE_OTHER): Payer: PPO | Admitting: Orthopaedic Surgery

## 2022-06-27 ENCOUNTER — Ambulatory Visit: Payer: Self-pay

## 2022-06-27 VITALS — Wt 262.0 lb

## 2022-06-27 DIAGNOSIS — M25562 Pain in left knee: Secondary | ICD-10-CM

## 2022-06-27 DIAGNOSIS — G8929 Other chronic pain: Secondary | ICD-10-CM | POA: Diagnosis not present

## 2022-06-27 DIAGNOSIS — M25561 Pain in right knee: Secondary | ICD-10-CM | POA: Diagnosis not present

## 2022-06-27 NOTE — Progress Notes (Signed)
The patient is a very pleasant 67 year old female sent from Dr. Derinda Late to evaluate and treat left knee pain.  She said she hyperextended her knee back in July and it was really painful after twisting injury again about a month ago.  She said since then with ibuprofen and Voltaren gel as well as activity modification and rest her knee feels much better overall.  She is walking without a limp or assistive ice.  She is never had an injection in that knee and no other injury that she is aware of.  She does have a family history of rheumatoid disease.  She is had surgery on her left ankle before and has had some left hip issues.  She points the medial joint line as a source of her pain with her left knee.  Today she denies any locking catching or any instability symptoms.  I did review all of her notes within epic.  She is not a diabetic.  She is active.  There is currently listed no fever, chills, nausea, vomiting.  Her BMI is 44.97.  On exam there is no knee joint effusion of her left knee.  Her range of motion shows that she hyperextends both knees to excellent flexion with a negative Murray's negative Lachman's exam.  She does have medial joint line tenderness of that left knee.  X-rays left knee show significant patellofemoral narrowing and arthritis but still well-maintained medial lateral compartments.  Since she is asymptomatic right now we will just recommend conservative treatment like she has been doing unless things worsen for her at all. Since she knows that if she develops locking catching or other symptoms to call us to come back and be seen.

## 2022-07-17 ENCOUNTER — Other Ambulatory Visit: Payer: Self-pay | Admitting: Internal Medicine

## 2022-07-17 DIAGNOSIS — I25118 Atherosclerotic heart disease of native coronary artery with other forms of angina pectoris: Secondary | ICD-10-CM

## 2022-08-13 ENCOUNTER — Other Ambulatory Visit: Payer: Self-pay | Admitting: Internal Medicine

## 2022-08-13 DIAGNOSIS — I25118 Atherosclerotic heart disease of native coronary artery with other forms of angina pectoris: Secondary | ICD-10-CM

## 2022-08-28 ENCOUNTER — Ambulatory Visit (INDEPENDENT_AMBULATORY_CARE_PROVIDER_SITE_OTHER): Payer: PPO | Admitting: Nurse Practitioner

## 2022-08-28 ENCOUNTER — Encounter: Payer: Self-pay | Admitting: Nurse Practitioner

## 2022-08-28 VITALS — BP 136/80 | Ht 64.0 in | Wt 260.0 lb

## 2022-08-28 DIAGNOSIS — L900 Lichen sclerosus et atrophicus: Secondary | ICD-10-CM | POA: Diagnosis not present

## 2022-08-28 MED ORDER — CLOBETASOL PROPIONATE 0.05 % EX OINT: TOPICAL_OINTMENT | CUTANEOUS | 1 refills | Status: DC

## 2022-08-28 NOTE — Progress Notes (Signed)
   Tracy Cabrera 10/05/1955 [id number]   History:  67 y.o. G2P2002 presents for medication management. Using Clobetasol ointment for management of lichen sclerosus. Only using every 3-4 weeks when it flares. Uses vaseline as needed.   Gynecologic History Patient's last menstrual period was 06/25/2000.   Contraception: post menopausal status Sexually active: No  Health Maintenance Last Pap: 08/24/2021. Results were: Normal neg HPV Last mammogram: 07/27/2021. Results were: Normal Last colonoscopy: 05/27/2019. Results were: Tubular adenoma, 3-year recall Last Dexa: 10/17/2021. Results were: T-score -1.2, FRAX 13% / 0.8%  Past medical history, past surgical history, family history and social history were all reviewed and documented in the EPIC chart.  ROS:  A ROS was performed and pertinent positives and negatives are included.  Exam:  Vitals:   08/28/22 1104  Weight: 260 lb (117.9 kg)   Body mass index is 44.63 kg/m.  General appearance:  Normal Genitourinary   Inguinal/mons:  Normal without inguinal adenopathy  External genitalia:  Minimal hypopigmentation at clitoral hood and around anus  BUS/Urethra/Skene's glands:  Normal  Vagina:  Normal appearing with normal color and discharge, no lesions  Cervix:  Normal appearing without discharge or lesions  Uterus:  Normal in size, shape and contour.  Midline and mobile, nontender  Adnexa/parametria:     Rt: Normal in size, without masses or tenderness.   Lt: Normal in size, without masses or tenderness.  Anus and perineum: Normal  Digital rectal exam: Deferred  Patient informed chaperone available to be present for breast and pelvic exam. Patient has requested no chaperone to be present. Patient has been advised what will be completed during breast and pelvic exam.   Assessment/Plan:  67 y.o. G2P2002 for medication management.   Lichen sclerosus - Plan: clobetasol ointment (TEMOVATE) 0.05 % as needed. Good management. Also using  Vaseline as barrier as needed.  Return in 1 year for breast and pelvic exam.   Tamela Gammon DNP, 11:06 AM 08/28/2022

## 2022-11-05 LAB — LAB REPORT - SCANNED
A1c: 6.7
EGFR: 88

## 2022-12-15 LAB — LAB REPORT - SCANNED: EGFR: 94

## 2022-12-31 ENCOUNTER — Encounter: Payer: Self-pay | Admitting: Cardiology

## 2023-01-09 ENCOUNTER — Encounter: Payer: Self-pay | Admitting: Cardiology

## 2023-02-09 ENCOUNTER — Other Ambulatory Visit: Payer: Self-pay | Admitting: Nurse Practitioner

## 2023-02-09 DIAGNOSIS — L900 Lichen sclerosus et atrophicus: Secondary | ICD-10-CM

## 2023-02-11 NOTE — Telephone Encounter (Signed)
Medication refill request: Clobetasole ointment  Last [place] check up: 08/28/22 Next AEX: not scheduled  Last MMG (if hormonal medication request): n/a  Refill authorized: 30g 0 rf pended for today

## 2023-04-29 ENCOUNTER — Ambulatory Visit: Payer: PPO | Admitting: Internal Medicine

## 2023-04-29 ENCOUNTER — Ambulatory Visit: Payer: Self-pay | Admitting: Cardiology

## 2023-05-24 ENCOUNTER — Other Ambulatory Visit: Payer: Self-pay | Admitting: Internal Medicine

## 2023-05-31 ENCOUNTER — Telehealth: Payer: Self-pay | Admitting: Cardiology

## 2023-05-31 MED ORDER — ISOSORBIDE MONONITRATE ER 30 MG PO TB24: 30.0000 mg | ORAL_TABLET | Freq: Every day | ORAL | 0 refills | Status: DC

## 2023-05-31 NOTE — Telephone Encounter (Signed)
*  STAT* If patient is at the pharmacy, call can be transferred to refill team.   1. Which medications need to be refilled? (please list name of each medication and dose if known)   isosorbide mononitrate (IMDUR) 30 MG 24 hr tablet   2. Would you like to learn more about the convenience, safety, & potential cost savings by using the [facility]?   3. Are you open to using the [facility] (Type [facility]. ).  4. Which pharmacy/location (including street and city if local pharmacy) is medication to be sent to?  [id number] - [place], [place] - [address]   5. Do they need a 30 day or 90 day supply?   90 day  Patient stated she is completely out of this medication.  Patient has appointment scheduled on 07/16/23.

## 2023-07-16 ENCOUNTER — Ambulatory Visit: Payer: PPO | Attending: Internal Medicine | Admitting: Cardiology

## 2023-07-16 ENCOUNTER — Encounter: Payer: Self-pay | Admitting: Cardiology

## 2023-07-16 VITALS — BP 111/79 | HR 80 | Resp 16 | Ht 64.0 in | Wt 269.2 lb

## 2023-07-16 DIAGNOSIS — I10 Essential (primary) hypertension: Secondary | ICD-10-CM

## 2023-07-16 DIAGNOSIS — E782 Mixed hyperlipidemia: Secondary | ICD-10-CM

## 2023-07-16 DIAGNOSIS — I25118 Atherosclerotic heart disease of native coronary artery with other forms of angina pectoris: Secondary | ICD-10-CM

## 2023-07-16 DIAGNOSIS — E66813 Obesity, class 3: Secondary | ICD-10-CM | POA: Diagnosis not present

## 2023-07-16 DIAGNOSIS — Z6841 Body Mass Index (BMI) 40.0 and over, adult: Secondary | ICD-10-CM

## 2023-07-16 NOTE — Patient Instructions (Signed)

## 2023-07-16 NOTE — Progress Notes (Signed)
Cardiology Office Note:  .   Date:  07/16/2023  ID:  Tracy Cabrera, DOB 1955-08-01, MRN [id number] PCP: Mosetta Putt, MD  [facility] Providers Cardiologist:  Yates Decamp, MD   History of Present Illness: .   Tracy Cabrera is a 68 y.o. Caucasian female with a strong family history of premature coronary disease, family history of "cardiomyopathy" in her sister who died at the age of 38 years in 1995, details not available, no other family members with sudden cardiac death or recurrent syncope, coronary calcium score in the 90th percentile in 2021 at 1442, coronary artery disease by cardiac catheterization on 07/26/2020 revealing 80% stenosis in the mid LAD however distal LAD was small as D2 was LAD equivalent hence medical therapy recommended, hypertension, prediabetes and hyperlipidemia. She has had sensorineural hearing loss diagnosed July 2021, mild obstructive sleep apnea on CPAP in 2018.    Discussed the use of AI scribe software for clinical note transcription with the patient, who gave verbal consent to proceed.  History of Present Illness   The patient, with a history of coronary artery disease and borderline diabetes, reports occasional chest pain and pressure. The discomfort is not severe and does not last long. Interestingly, the patient notes that the pain is not associated with physical activity but tends to occur when she is sitting down at night. The patient has never used nitroglycerin for this pain.  The patient also mentions that she has been relatively sedentary due to knee and hip problems, but she is slowly starting to increase her physical activity again. She is also dealing with significant stress as a caregiver for her husband, who has end-stage emphysema and is largely bedridden. The patient expresses a desire for respite and a need for personal time, but feels guilty about leaving her husband.  In terms of her diabetes, the patient is on metformin and has seen a  decrease in her A1C levels. However, she has not noticed a significant decrease in her appetite or weight. The patient expresses interest in starting a GLP-1 agonist for weight loss.      Labs   External Labs:  Review of Systems  Cardiovascular:  Positive for chest pain. Negative for dyspnea on exertion and leg swelling.   Physical Exam:   VS:  BP 111/79 (BP Location: Left Arm, Patient Position: Sitting, Cuff Size: Large)   Pulse 80   Resp 16   Ht 5\' 4"  (1.626 m)   Wt 269 lb 3.2 oz (122.1 kg)   LMP 06/25/2000   SpO2 94%   BMI 46.21 kg/m    Wt Readings from Last 3 Encounters:  07/16/23 269 lb 3.2 oz (122.1 kg)  08/28/22 260 lb (117.9 kg)  06/27/22 262 lb (118.8 kg)     Physical Exam Constitutional:      Appearance: She is morbidly obese.  Neck:     Vascular: No carotid bruit or JVD.  Cardiovascular:     Rate and Rhythm: Normal rate and regular rhythm.     Pulses: Intact distal pulses.     Heart sounds: Normal heart sounds. No murmur heard.    No gallop.  Pulmonary:     Effort: Pulmonary effort is normal.     Breath sounds: Normal breath sounds.  Abdominal:     General: Bowel sounds are normal.     Palpations: Abdomen is soft.  Musculoskeletal:     Right lower leg: No edema.     Left lower leg: No  edema.   Studies Reviewed: Marland Kitchen    Left Heart Catheterization 07/26/20:    EKG:    EKG Interpretation Date/Time:  Tuesday July 16 2023 16:06:21 EST Ventricular Rate:  78 PR Interval:  154 QRS Duration:  92 QT Interval:  396 QTC Calculation: 451 R Axis:   23  Text Interpretation: EKG 07/16/2023: Normal sinus rhythm at rate of 78 bpm, normal axis, nonspecific inferolateral sagging ST changes.  No significant change from 07/26/2020. Confirmed by Delrae Rend [phone]) on 07/16/2023 4:22:46 PM    Medications and allergies    Allergies  Allergen Reactions   Shrimp [Shellfish Allergy] Hives and Swelling   Other     fluress eye drops   Oxycodone-Acetaminophen  Nausea Only   Vicodin [Hydrocodone-Acetaminophen] Nausea Only         Current Outpatient Medications:    albuterol (PROVENTIL HFA;VENTOLIN HFA) 108 (90 BASE) MCG/ACT inhaler, Inhale 2 puffs into the lungs every 6 (six) hours as needed., Disp: , Rfl:    ALPRAZolam (XANAX) 0.5 MG tablet, Take 0.5 mg by mouth 2 (two) times daily as needed for sleep., Disp: , Rfl:    aspirin (ASPIRIN CHILDRENS) 81 MG chewable tablet, Chew 1 tablet (81 mg total) by mouth daily., Disp: 180 tablet, Rfl: 2   Calcium Carbonate-Vitamin D 600-200 MG-UNIT CAPS, Take 1 tablet by mouth daily., Disp: , Rfl:    chlorthalidone (HYGROTON) 25 MG tablet, Take 25 mg by mouth daily. , Disp: , Rfl:    Cholecalciferol 25 MCG (1000 UT) capsule, Take 1,000 Units by mouth daily., Disp: , Rfl:    clobetasol ointment (TEMOVATE) 0.05 %, APPLY ONE APPLICATION TOPICALLY TWICE A WEEK, Disp: 30 g, Rfl: 1   ezetimibe (ZETIA) 10 MG tablet, Take 1 tablet (10 mg total) by mouth daily., Disp: 30 tablet, Rfl: 0   famciclovir (FAMVIR) 250 MG tablet, Take 250 mg by mouth daily as needed (fever blisters)., Disp: , Rfl:    fexofenadine (ALLEGRA) 180 MG tablet, Take 180 mg by mouth daily as needed for allergies. , Disp: , Rfl:    fish oil-omega-3 fatty acids 1000 MG capsule, Take 1,400 mg by mouth daily., Disp: , Rfl:    FLOVENT HFA 110 MCG/ACT inhaler, Inhale 2 puffs into the lungs 2 (two) times daily., Disp: , Rfl:    fluticasone (FLONASE) 50 MCG/ACT nasal spray, Place 2 sprays into both nostrils daily as needed for allergies. , Disp: , Rfl:    gabapentin (NEURONTIN) 600 MG tablet, Take 300 mg by mouth as needed (restless leg syndrome)., Disp: , Rfl:    ibuprofen (ADVIL,MOTRIN) 200 MG tablet, Take 200-400 mg by mouth every 6 (six) hours as needed for mild pain or moderate pain., Disp: , Rfl:    isosorbide mononitrate (IMDUR) 30 MG 24 hr tablet, Take 1 tablet (30 mg total) by mouth daily., Disp: 90 tablet, Rfl: 0   losartan (COZAAR) 100 MG tablet, Take  1 tablet (100 mg total) by mouth every evening. (Patient taking differently: Take 100 mg by mouth at bedtime.), Disp: 90 tablet, Rfl: 3   metFORMIN (GLUCOPHAGE-XR) 500 MG 24 hr tablet, Take 500 mg by mouth 2 (two) times daily., Disp: , Rfl:    metoprolol tartrate (LOPRESSOR) 25 MG tablet, TAKE ONE TABLET BY MOUTH TWICE DAILY, Disp: 180 tablet, Rfl: 3   naproxen sodium (ANAPROX) 220 MG tablet, Take 220 mg by mouth daily as needed (arthritis). , Disp: , Rfl:    nitroGLYCERIN (NITROSTAT) 0.4 MG SL tablet, Place 1 tablet (  0.4 mg total) under the tongue every 5 (five) minutes as needed for chest pain., Disp: 30 tablet, Rfl: 3   rosuvastatin (CRESTOR) 40 MG tablet, Take 40 mg by mouth daily., Disp: , Rfl:    ASSESSMENT AND PLAN: .      ICD-10-CM   1. Coronary artery disease of native artery of native heart with stable angina pectoris (HCC)  I25.118 EKG 12-Lead    2. Primary hypertension  I10     3. Mixed hyperlipidemia  E78.2     4. Class 3 severe obesity due to excess calories with serious comorbidity and body mass index (BMI) of 45.0 to 49.9 in adult [facility])  B14.782    Z68.42    E66.01       Assessment and Plan    Coronary Artery Disease Stable angina, infrequent and not associated with physical exertion. No use of nitroglycerin. Medications include Metoprolol, Losartan, Isosorbide Mononitrate, and Rosuvastatin 40mg . -Continue current medications. -Return in 1 year unless symptoms change.  Hypertension Well controlled on current medications (Metoprolol, Losartan). -Continue current medications.  Obesity Discussed the need for weight loss and the potential benefits of GLP-1 agonist (Mounjaro or Ozempic). -Discuss with Dr. Vergia Alberts about starting a GLP-1 agonist for weight loss and CAD.  Greggory Keen would be a good option. -Eat slowly and take breaks during meals to enhance the effect of the medication.  General Health Maintenance Labs were done in April of the previous year by Dr.  Vergia Alberts, but not available during this visit. -Request patient to send the lab results. -Continue monitoring blood glucose levels as patient has a history of borderline diabetes and is currently on Metformin.    Signed,  Yates Decamp, MD, [facility] 07/16/2023, 4:38 PM [facility] [address] [place], [place] [address] Phone: [phone]. Fax:  [phone]

## 2023-08-24 ENCOUNTER — Other Ambulatory Visit: Payer: Self-pay | Admitting: Cardiology

## 2023-08-31 ENCOUNTER — Encounter: Payer: Self-pay | Admitting: Cardiology

## 2023-08-31 DIAGNOSIS — I25118 Atherosclerotic heart disease of native coronary artery with other forms of angina pectoris: Secondary | ICD-10-CM

## 2023-09-02 MED ORDER — METOPROLOL TARTRATE 25 MG PO TABS: 25.0000 mg | ORAL_TABLET | Freq: Two times a day (BID) | ORAL | 3 refills | Status: AC

## 2023-10-17 LAB — HM MAMMOGRAPHY

## 2024-01-07 LAB — HM MAMMOGRAPHY

## 2024-05-07 ENCOUNTER — Other Ambulatory Visit: Payer: Self-pay | Admitting: Family Medicine

## 2024-05-07 DIAGNOSIS — R221 Localized swelling, mass and lump, neck: Secondary | ICD-10-CM

## 2024-05-12 ENCOUNTER — Other Ambulatory Visit

## 2024-05-15 ENCOUNTER — Ambulatory Visit
Admission: RE | Admit: 2024-05-15 | Discharge: 2024-05-15 | Disposition: A | Discharge status: Home / Self Care | Source: Ambulatory Visit | Attending: Family Medicine | Admitting: Family Medicine

## 2024-05-15 DIAGNOSIS — R221 Localized swelling, mass and lump, neck: Secondary | ICD-10-CM

## 2024-05-15 MED ORDER — IOPAMIDOL (ISOVUE-370) INJECTION 76%
80.0000 mL | Freq: Once | INTRAVENOUS | Status: AC | PRN
  Administered 2024-05-15: 80 mL via INTRAVENOUS

## 2024-05-20 LAB — HM DEXA SCAN

## 2024-06-02 ENCOUNTER — Other Ambulatory Visit: Payer: Self-pay | Admitting: Cardiology

## 2024-06-04 ENCOUNTER — Encounter: Payer: Self-pay | Admitting: Cardiology

## 2024-06-04 MED ORDER — ISOSORBIDE MONONITRATE ER 30 MG PO TB24: 30.0000 mg | ORAL_TABLET | Freq: Every day | ORAL | 0 refills | Status: AC

## 2024-06-24 ENCOUNTER — Encounter: Payer: Self-pay | Admitting: Nurse Practitioner

## 2024-06-24 ENCOUNTER — Ambulatory Visit (INDEPENDENT_AMBULATORY_CARE_PROVIDER_SITE_OTHER): Admitting: Nurse Practitioner

## 2024-06-24 VITALS — BP 116/70 | HR 79 | Ht 64.5 in | Wt 238.0 lb

## 2024-06-24 DIAGNOSIS — Z780 Asymptomatic menopausal state: Secondary | ICD-10-CM

## 2024-06-24 DIAGNOSIS — Z01419 Encounter for gynecological examination (general) (routine) without abnormal findings: Secondary | ICD-10-CM

## 2024-06-24 DIAGNOSIS — B009 Herpesviral infection, unspecified: Secondary | ICD-10-CM

## 2024-06-24 DIAGNOSIS — Z9189 Other specified personal risk factors, not elsewhere classified: Secondary | ICD-10-CM | POA: Diagnosis not present

## 2024-06-24 DIAGNOSIS — M810 Age-related osteoporosis without current pathological fracture: Secondary | ICD-10-CM

## 2024-06-24 DIAGNOSIS — L900 Lichen sclerosus et atrophicus: Secondary | ICD-10-CM

## 2024-06-24 MED ORDER — CLOBETASOL PROPIONATE 0.05 % EX OINT: TOPICAL_OINTMENT | CUTANEOUS | 2 refills | Status: AC

## 2024-06-24 NOTE — Progress Notes (Signed)
 "  Tracy Cabrera 1956-02-12 [id number]   History:  68 y.o. G2P2002 presents for breast and pelvic exam. Postmenopausal - no HRT, no bleeding. Normal pap history. HTN, HLD, pre-diabetes, osteoporosis managed by PCP. Started Fosamax a couple of weeks ago. Lichen sclerosus managed by Clobetasol  ointment, only using once every 4-6 weeks. Has an area that is bothersome at times. HSV-1.   Gynecologic History Patient's last menstrual period was 06/25/2000.   Contraception: post menopausal status Sexually active: Yes  Health Maintenance Last Pap: 08/24/2021. Results were: Normal neg HPV Last mammogram: 2025. Results were: Benign biopsy Last colonoscopy: 2024, 3-year recall Last Dexa: 2025. Results were: osteoporosis in left forearm, other sites osteopenic  Past medical history, past surgical history, family history and social history were all reviewed and documented in the EPIC chart. Married. CPA. Daughter is deceased, son lives in [place]. Raising great nephews ages 65 and 52. Mother has osteoporosis.   ROS:  A ROS was performed and pertinent positives and negatives are included.  Exam:  Vitals:   06/24/24 1503  BP: 116/70  Pulse: 79  SpO2: 96%  Weight: 238 lb (108 kg)  Height: 5' 4.5 (1.638 m)    Body mass index is 40.22 kg/m.  General appearance:  Normal Thyroid:  Symmetrical, normal in size, without palpable masses or nodularity. Respiratory  Auscultation:  Clear without wheezing or rhonchi Cardiovascular  Auscultation:  Regular rate, without rubs, murmurs or gallops  Edema/varicosities:  Not grossly evident Abdominal  Soft,nontender, without masses, guarding or rebound.  Liver/spleen:  No organomegaly noted  Hernia:  None appreciated  Skin  Inspection:  Grossly normal Breasts: Examined lying and sitting.   Right: Without masses, retractions, nipple discharge or axillary adenopathy.   Left: Without masses, retractions, nipple discharge or axillary  adenopathy. Pelvic: External genitalia: Redness and hypopigmentation from clitoral hood to anus c/w LS              Urethra:  normal appearing urethra with no masses, tenderness or lesions              Bartholins and Skenes: normal                 Vagina: normal appearing vagina with normal color and discharge, no lesions              Cervix: no lesions Bimanual Exam:  Uterus:  no masses or tenderness              Adnexa: no mass, fullness, tenderness              Rectovaginal: Deferred              Anus:  normal, no lesions  Kari Leaven, CMA present as chaperone.   Assessment/Plan:  68 y.o. [id number] for breast and pelvic exam.   Encounter for breast and pelvic examination - Education provided on SBEs, importance of preventative screenings, current guidelines, high calcium diet, regular exercise, and multivitamin daily. Labs with PCP.   Lichen sclerosus - Plan: clobetasol  ointment (TEMOVATE ) 0.05 % as needed. Recommend using twice weekly for better management.   Postmenopausal - No HRT, no bleeding  Age-related osteoporosis without current pathological fracture - in forearm, other sites osteopenic per patient. PCP manages. Started Fosamax a couple of weeks ago.   Screening for cervical cancer - Normal Pap history. No longer screening per guidelines.   Screening for breast cancer - Benign breast biopsy this year. Continue annual screenings. Normal breast exam today.  Screening for colon cancer - 2024 colonoscopy. Will repeat at GI's recommended interval.   Return in about 1 year (around 06/24/2025) for B&P (high risk).     Tracy DELENA Shutter DNP, 3:49 PM 06/24/2024  "

## 2024-07-01 ENCOUNTER — Ambulatory Visit: Payer: Self-pay | Admitting: Nurse Practitioner

## 2025-06-29 ENCOUNTER — Encounter: Admitting: Nurse Practitioner
